# Patient Record
Sex: Male | Born: 1955 | Race: White | Hispanic: No | Marital: Married | Smoking: Never smoker
Health system: Southern US, Community
[De-identification: ages and names within clinical notes are randomized; demographics above are authoritative.]

## PROBLEM LIST (undated history)

## (undated) DIAGNOSIS — K279 Peptic ulcer, site unspecified, unspecified as acute or chronic, without hemorrhage or perforation: Secondary | ICD-10-CM

## (undated) DIAGNOSIS — Z9289 Personal history of other medical treatment: Secondary | ICD-10-CM

## (undated) HISTORY — PX: HERNIA REPAIR: SHX51

## (undated) HISTORY — PX: APPENDECTOMY: SHX54

## (undated) HISTORY — DX: Personal history of other medical treatment: Z92.89

## (undated) HISTORY — DX: Peptic ulcer, site unspecified, unspecified as acute or chronic, without hemorrhage or perforation: K27.9

---

## 2003-07-02 ENCOUNTER — Encounter: Admission: RE | Admit: 2003-07-02 | Discharge: 2003-07-02 | Payer: Self-pay | Admitting: Specialist

## 2004-08-30 ENCOUNTER — Ambulatory Visit: Payer: Self-pay | Admitting: Family Medicine

## 2004-09-14 ENCOUNTER — Ambulatory Visit: Payer: Self-pay | Admitting: Family Medicine

## 2005-07-02 ENCOUNTER — Ambulatory Visit (HOSPITAL_COMMUNITY): Admission: RE | Admit: 2005-07-02 | Discharge: 2005-07-02 | Payer: Self-pay | Admitting: Family Medicine

## 2005-07-02 ENCOUNTER — Encounter (INDEPENDENT_AMBULATORY_CARE_PROVIDER_SITE_OTHER): Payer: Self-pay | Admitting: Specialist

## 2005-07-02 ENCOUNTER — Ambulatory Visit (HOSPITAL_COMMUNITY): Admission: EM | Admit: 2005-07-02 | Discharge: 2005-07-03 | Payer: Self-pay | Admitting: Emergency Medicine

## 2005-07-02 ENCOUNTER — Ambulatory Visit: Payer: Self-pay | Admitting: Family Medicine

## 2005-08-04 ENCOUNTER — Emergency Department (HOSPITAL_COMMUNITY): Admission: EM | Admit: 2005-08-04 | Discharge: 2005-08-05 | Payer: Self-pay | Admitting: Emergency Medicine

## 2006-09-04 ENCOUNTER — Ambulatory Visit: Payer: Self-pay | Admitting: Family Medicine

## 2006-09-04 LAB — CONVERTED CEMR LAB
Bilirubin Urine: NEGATIVE
Blood in Urine, dipstick: NEGATIVE
Nitrite: NEGATIVE
Urobilinogen, UA: 0.2
WBC Urine, dipstick: NEGATIVE
pH: 5

## 2006-09-05 LAB — CONVERTED CEMR LAB
ALT: 29 units/L (ref 0–53)
AST: 21 units/L (ref 0–37)
Albumin: 4.1 g/dL (ref 3.5–5.2)
Basophils Absolute: 0 10*3/uL (ref 0.0–0.1)
Bilirubin, Direct: 0.1 mg/dL (ref 0.0–0.3)
CO2: 29 meq/L (ref 19–32)
Calcium: 9.5 mg/dL (ref 8.4–10.5)
Chloride: 109 meq/L (ref 96–112)
Cholesterol: 163 mg/dL (ref 0–200)
Eosinophils Absolute: 0.3 10*3/uL (ref 0.0–0.6)
Eosinophils Relative: 4.9 % (ref 0.0–5.0)
Glucose, Bld: 93 mg/dL (ref 70–99)
HCT: 45.5 % (ref 39.0–52.0)
HDL: 34.4 mg/dL — ABNORMAL LOW (ref >39.0)
Hemoglobin: 15.5 g/dL (ref 13.0–17.0)
MCHC: 34.1 g/dL (ref 30.0–36.0)
MCV: 89.3 fL (ref 78.0–100.0)
Monocytes Absolute: 0.5 10*3/uL (ref 0.2–0.7)
Monocytes Relative: 8 % (ref 3.0–11.0)
PSA: 1.02 ng/mL (ref 0.10–4.00)
Potassium: 4.3 meq/L (ref 3.5–5.1)
RDW: 12.5 % (ref 11.5–14.6)
Sodium: 144 meq/L (ref 135–145)
Triglycerides: 122 mg/dL (ref 0–149)

## 2006-09-11 ENCOUNTER — Ambulatory Visit: Payer: Self-pay | Admitting: Family Medicine

## 2006-09-24 ENCOUNTER — Ambulatory Visit: Payer: Self-pay | Admitting: Gastroenterology

## 2006-09-25 ENCOUNTER — Telehealth: Payer: Self-pay | Admitting: Family Medicine

## 2007-11-03 ENCOUNTER — Ambulatory Visit: Payer: Self-pay | Admitting: Family Medicine

## 2007-11-03 DIAGNOSIS — K279 Peptic ulcer, site unspecified, unspecified as acute or chronic, without hemorrhage or perforation: Secondary | ICD-10-CM | POA: Insufficient documentation

## 2007-12-09 ENCOUNTER — Ambulatory Visit: Payer: Self-pay | Admitting: Family Medicine

## 2007-12-09 LAB — CONVERTED CEMR LAB
Bilirubin Urine: NEGATIVE
Glucose, Urine, Semiquant: NEGATIVE
Ketones, urine, test strip: NEGATIVE
Specific Gravity, Urine: 1.02

## 2007-12-12 LAB — CONVERTED CEMR LAB
ALT: 22 units/L (ref 0–53)
BUN: 22 mg/dL (ref 6–23)
Calcium: 9.2 mg/dL (ref 8.4–10.5)
Cholesterol: 174 mg/dL (ref 0–200)
Creatinine, Ser: 1 mg/dL (ref 0.4–1.5)
Eosinophils Relative: 2.2 % (ref 0.0–5.0)
GFR calc non Af Amer: 84 mL/min
HCT: 44.6 % (ref 39.0–52.0)
HDL: 39.6 mg/dL (ref >39.0)
Lymphocytes Relative: 30.6 % (ref 12.0–46.0)
MCHC: 34.4 g/dL (ref 30.0–36.0)
MCV: 91.1 fL (ref 78.0–100.0)
Monocytes Absolute: 0.4 10*3/uL (ref 0.1–1.0)
Monocytes Relative: 6.3 % (ref 3.0–12.0)
Neutro Abs: 3.7 10*3/uL (ref 1.4–7.7)
Potassium: 4.2 meq/L (ref 3.5–5.1)
Sodium: 143 meq/L (ref 135–145)
TSH: 0.68 microintl units/mL (ref 0.35–5.50)
Total CHOL/HDL Ratio: 4.4
Total Protein: 7.1 g/dL (ref 6.0–8.3)
Triglycerides: 129 mg/dL (ref 0–149)
VLDL: 26 mg/dL (ref 0–40)
WBC: 6 10*3/uL (ref 4.5–10.5)

## 2007-12-16 ENCOUNTER — Ambulatory Visit: Payer: Self-pay | Admitting: Family Medicine

## 2008-02-25 ENCOUNTER — Ambulatory Visit: Payer: Self-pay | Admitting: Gastroenterology

## 2008-03-08 ENCOUNTER — Encounter: Payer: Self-pay | Admitting: Family Medicine

## 2008-03-08 ENCOUNTER — Ambulatory Visit: Payer: Self-pay

## 2008-04-26 ENCOUNTER — Telehealth: Payer: Self-pay | Admitting: Family Medicine

## 2008-05-04 ENCOUNTER — Encounter: Payer: Self-pay | Admitting: Family Medicine

## 2010-03-14 ENCOUNTER — Ambulatory Visit: Payer: Self-pay | Admitting: Family Medicine

## 2010-04-11 ENCOUNTER — Encounter: Payer: Self-pay | Admitting: Family Medicine

## 2010-04-25 ENCOUNTER — Encounter: Payer: Self-pay | Admitting: Family Medicine

## 2010-04-25 ENCOUNTER — Ambulatory Visit (INDEPENDENT_AMBULATORY_CARE_PROVIDER_SITE_OTHER): Payer: BLUE CROSS/BLUE SHIELD | Admitting: Family Medicine

## 2010-04-25 VITALS — BP 140/90 | HR 77 | Temp 98.5°F | Wt 206.0 lb

## 2010-04-25 DIAGNOSIS — Z0000 Encounter for general adult medical examination without abnormal findings: Secondary | ICD-10-CM

## 2010-04-25 NOTE — Progress Notes (Signed)
  Subjective:    Patient ID: Bryan Hurst, male    DOB: 02-Oct-1955, 55 y.o.   MRN: [id number]  HPI 55 yr old male for a cpx. He feels well  And has no complaints. He still runs every day.    Review of Systems  Constitutional: Negative.   HENT: Negative.   Eyes: Negative.   Respiratory: Negative.   Cardiovascular: Negative.   Gastrointestinal: Negative.   Genitourinary: Negative.   Musculoskeletal: Negative.   Skin: Negative.   Neurological: Negative.   Hematological: Negative.   Psychiatric/Behavioral: Negative.        Objective:   Physical Exam  Constitutional: He is oriented to person, place, and time. He appears well-developed and well-nourished. No distress.  HENT:  Head: Normocephalic and atraumatic.  Right Ear: External ear normal.  Left Ear: External ear normal.  Nose: Nose normal.  Mouth/Throat: Oropharynx is clear and moist. No oropharyngeal exudate.  Eyes: Conjunctivae and EOM are normal. Pupils are equal, round, and reactive to light. Right eye exhibits no discharge. Left eye exhibits no discharge. No scleral icterus.  Neck: Neck supple. No JVD present. No tracheal deviation present. No thyromegaly present.  Cardiovascular: Normal rate, regular rhythm, normal heart sounds and intact distal pulses.  Exam reveals no gallop and no friction rub.   No murmur heard. Pulmonary/Chest: Effort normal and breath sounds normal. No respiratory distress. He has no wheezes. He has no rales. He exhibits no tenderness.  Abdominal: Soft. Bowel sounds are normal. He exhibits no distension and no mass. There is no tenderness. There is no rebound and no guarding.  Genitourinary: Rectum normal, prostate normal and penis normal. Guaiac negative stool. No penile tenderness.  Musculoskeletal: Normal range of motion. He exhibits no edema and no tenderness.  Lymphadenopathy:    He has no cervical adenopathy.  Neurological: He is alert and oriented to person, place, and time. He has normal  reflexes. No cranial nerve deficit. He exhibits normal muscle tone. Coordination normal.  Skin: Skin is warm and dry. No rash noted. He is not diaphoretic. No erythema. No pallor.  Psychiatric: He has a normal mood and affect. His behavior is normal. Judgment and thought content normal.          Assessment & Plan:  Get labs soon. He will keep an eye on the BP.

## 2010-05-02 ENCOUNTER — Telehealth: Payer: Self-pay

## 2010-05-02 ENCOUNTER — Other Ambulatory Visit (INDEPENDENT_AMBULATORY_CARE_PROVIDER_SITE_OTHER): Payer: BLUE CROSS/BLUE SHIELD

## 2010-05-02 ENCOUNTER — Other Ambulatory Visit: Payer: Self-pay | Admitting: Family Medicine

## 2010-05-02 DIAGNOSIS — Z0000 Encounter for general adult medical examination without abnormal findings: Secondary | ICD-10-CM

## 2010-05-02 LAB — HEPATIC FUNCTION PANEL
Albumin: 4 g/dL (ref 3.5–5.2)
Alkaline Phosphatase: 59 U/L (ref 39–117)
Bilirubin, Direct: 0.2 mg/dL (ref 0.0–0.3)
Total Bilirubin: 1.1 mg/dL (ref 0.3–1.2)

## 2010-05-02 LAB — LIPID PANEL
LDL Cholesterol: 110 mg/dL — ABNORMAL HIGH (ref 0–99)
Total CHOL/HDL Ratio: 4
VLDL: 22.6 mg/dL (ref 0.0–40.0)

## 2010-05-02 LAB — URINALYSIS
Nitrite: NEGATIVE
Urobilinogen, UA: 0.2 (ref 0.0–1.0)

## 2010-05-02 LAB — CBC WITH DIFFERENTIAL/PLATELET
Basophils Absolute: 0 10*3/uL (ref 0.0–0.1)
Hemoglobin: 15.8 g/dL (ref 13.0–17.0)
Lymphs Abs: 2.3 10*3/uL (ref 0.7–4.0)
MCHC: 34.7 g/dL (ref 30.0–36.0)
MCV: 90.3 fl (ref 78.0–100.0)
Monocytes Absolute: 0.5 10*3/uL (ref 0.1–1.0)
Neutro Abs: 3 10*3/uL (ref 1.4–7.7)
Neutrophils Relative %: 49.4 % (ref 43.0–77.0)
WBC: 6.1 10*3/uL (ref 4.5–10.5)

## 2010-05-02 LAB — BASIC METABOLIC PANEL
CO2: 31 mEq/L (ref 19–32)
Calcium: 9.2 mg/dL (ref 8.4–10.5)
GFR: 65.77 mL/min (ref >60.00)
Potassium: 4.7 mEq/L (ref 3.5–5.1)
Sodium: 141 mEq/L (ref 135–145)

## 2010-05-02 LAB — TSH: TSH: 0.87 u[IU]/mL (ref 0.35–5.50)

## 2010-05-02 NOTE — Telephone Encounter (Signed)
Pt called mess left labs normal and to return call if questions

## 2010-05-02 NOTE — Telephone Encounter (Signed)
Message copied by Madison Hickman on Tue May 02, 2010  2:42 PM ------      Message from: Dwaine Deter      Created: Tue May 02, 2010 11:20 AM       Normal except slightly high chol. Watch the diet

## 2010-05-15 ENCOUNTER — Telehealth: Payer: Self-pay | Admitting: *Deleted

## 2010-05-15 NOTE — Telephone Encounter (Signed)
Please call lab results to pt.

## 2010-05-16 NOTE — Telephone Encounter (Signed)
Pt informed. Mailed copy of results per Pt request.

## 2010-05-16 NOTE — Telephone Encounter (Signed)
These were from 05-02-10. We called and left a message that they were normal. Please call him back

## 2010-06-02 NOTE — Op Note (Signed)
Bryan, Hurst                ACCOUNT NO.:  [id number]   MEDICAL RECORD NO.:  [id number]          PATIENT TYPE:  INP   LOCATION:  8119                         FACILITY:  [facility]   PHYSICIAN:  Lebron Conners, M.D.   DATE OF BIRTH:  1955/07/15   DATE OF PROCEDURE:  07/02/2005  DATE OF DISCHARGE:  07/03/2005                                 OPERATIVE REPORT   PREOPERATIVE DIAGNOSIS:  Acute appendicitis.   POSTOPERATIVE DIAGNOSIS:  Acute appendicitis.   OPERATION:  Laparoscopic appendectomy.   SURGEON:  Lebron Conners, M.D.   ANESTHESIA:  General.   BLOOD LOSS:  Minimal.   COMPLICATIONS:  None.   SPECIMEN:  Appendix.   CONDITION TO PACU:  Good.   PROCEDURE:  After the patient was monitored and anesthetized and had a  urinary catheter and routine preparation and draping of the abdomen, I  anesthetized the area just below the umbilicus with long-acting local  anesthetic.  I then made a 2 cm vertical incision dissected down to the  fascia and made a 2 cm vertical incision and bluntly entered the peritoneal  cavity.  I placed a 0 Vicryl pursestring suture in the fascia and secured a  Hassan cannula and inflated the abdomen with carbon dioxide.  I examined the  right lower quadrant and saw an inflamed appendix.  I then placed a 5 mm  right upper quadrant port and an 11 mm lower abdominal port just to the left  of the midline under direct view, noting that no viscera were injured on  implantation of the ports through anesthetized sites.  I grasped the  appendix with large ratcheting grasper and elevated it and found that it was  free of adhesions to the pelvic wall.  I could clearly see the base of the  appendix and saw that it was healthy.  After working a little bit to  decrease the volume of the mesentery I stapled across the appendix at its  base utilizing a endoscopic cutting stapler included the mesentery and  stapled.  A second firing was required to complete the staple  line and there  was a secure amputation of the appendix.  I put it in a plastic pouch and  held it in the right gutter.  I irrigated the operative area and removed the  irrigant and got good hemostasis.  After I was satisfied with hemostasis, I  removed the appendix from the umbilical incision and tied that pursestring  suture.  I again examined the operative area and the other areas  of abdomen and saw good hemostasis and saw no further abnormalities.  I  allowed the carbon dioxide to escape after removing the right upper quadrant  port and then removed the lower midline port and closed all incisions with  intracuticular 4-0 Vicryl and Steri-Strips.  The patient was stable  throughout and went to PACU in good condition.      Lebron Conners, M.D.  Electronically Signed     WB/MEDQ  D:  07/02/2005  T:  07/03/2005  Job:  [id number]   cc:   Jeannett Senior A.  Clent Ridges, M.D. [facility]  [address] [place]  [place] [address]

## 2012-07-21 ENCOUNTER — Ambulatory Visit (INDEPENDENT_AMBULATORY_CARE_PROVIDER_SITE_OTHER): Payer: 59 | Admitting: Family Medicine

## 2012-07-21 ENCOUNTER — Encounter: Payer: Self-pay | Admitting: Family Medicine

## 2012-07-21 VITALS — BP 120/80 | HR 77 | Temp 98.3°F | Wt 206.0 lb

## 2012-07-21 DIAGNOSIS — Z5189 Encounter for other specified aftercare: Secondary | ICD-10-CM

## 2012-07-21 DIAGNOSIS — S0101XD Laceration without foreign body of scalp, subsequent encounter: Secondary | ICD-10-CM

## 2012-07-21 NOTE — Progress Notes (Signed)
  Subjective:    Patient ID: Bryan Hurst, male    DOB: 1955-10-12, 57 y.o.   MRN: [id number]  HPI Here to remove staples form the vertex of the scalp. On 07-11-12 he was playing doubles tennis when his partner and he ran into each other. This made him lose his balance and fall, striking his head on the concrete. He went to Urgent Care and had 3 staples placed. There was no LOC and no concussion sx. The area feels fine now.    Review of Systems  Constitutional: Negative.   Neurological: Negative.        Objective:   Physical Exam  Constitutional: He is oriented to person, place, and time. He appears well-developed and well-nourished.  Neurological: He is alert and oriented to person, place, and time.  Skin:  The scalp wound is clean and closed          Assessment & Plan:  All staples were removed. Recheck prn

## 2013-12-16 ENCOUNTER — Other Ambulatory Visit (INDEPENDENT_AMBULATORY_CARE_PROVIDER_SITE_OTHER): Payer: 59

## 2013-12-16 DIAGNOSIS — Z0000 Encounter for general adult medical examination without abnormal findings: Secondary | ICD-10-CM

## 2013-12-16 LAB — POCT URINALYSIS DIPSTICK
Bilirubin, UA: NEGATIVE
Blood, UA: NEGATIVE
GLUCOSE UA: NEGATIVE
Ketones, UA: NEGATIVE
Leukocytes, UA: NEGATIVE
Nitrite, UA: NEGATIVE
PH UA: 6
Protein, UA: NEGATIVE
Spec Grav, UA: 1.015
UROBILINOGEN UA: 0.2

## 2013-12-16 LAB — CBC WITH DIFFERENTIAL/PLATELET
BASOS PCT: 0.4 % (ref 0.0–3.0)
Basophils Absolute: 0 10*3/uL (ref 0.0–0.1)
EOS ABS: 0.2 10*3/uL (ref 0.0–0.7)
EOS PCT: 2.8 % (ref 0.0–5.0)
HCT: 48.1 % (ref 39.0–52.0)
Hemoglobin: 16.2 g/dL (ref 13.0–17.0)
LYMPHS PCT: 34.8 % (ref 12.0–46.0)
Lymphs Abs: 2.4 10*3/uL (ref 0.7–4.0)
MCHC: 33.6 g/dL (ref 30.0–36.0)
MCV: 89.4 fl (ref 78.0–100.0)
MONO ABS: 0.5 10*3/uL (ref 0.1–1.0)
Monocytes Relative: 7 % (ref 3.0–12.0)
Neutro Abs: 3.7 10*3/uL (ref 1.4–7.7)
Neutrophils Relative %: 55 % (ref 43.0–77.0)
Platelets: 184 10*3/uL (ref 150.0–400.0)
RBC: 5.38 Mil/uL (ref 4.22–5.81)
RDW: 13.6 % (ref 11.5–15.5)
WBC: 6.8 10*3/uL (ref 4.0–10.5)

## 2013-12-16 LAB — HEPATIC FUNCTION PANEL
ALBUMIN: 4.5 g/dL (ref 3.5–5.2)
ALT: 24 U/L (ref 0–53)
AST: 22 U/L (ref 0–37)
Alkaline Phosphatase: 65 U/L (ref 39–117)
BILIRUBIN TOTAL: 1.2 mg/dL (ref 0.2–1.2)
Bilirubin, Direct: 0.2 mg/dL (ref 0.0–0.3)
TOTAL PROTEIN: 7 g/dL (ref 6.0–8.3)

## 2013-12-16 LAB — BASIC METABOLIC PANEL
BUN: 24 mg/dL — AB (ref 6–23)
CHLORIDE: 104 meq/L (ref 96–112)
CO2: 28 mEq/L (ref 19–32)
CREATININE: 1.3 mg/dL (ref 0.4–1.5)
Calcium: 9.2 mg/dL (ref 8.4–10.5)
GFR: 62.54 mL/min (ref >60.00)
GLUCOSE: 87 mg/dL (ref 70–99)
POTASSIUM: 4.9 meq/L (ref 3.5–5.1)
Sodium: 138 mEq/L (ref 135–145)

## 2013-12-16 LAB — LIPID PANEL
CHOL/HDL RATIO: 5
CHOLESTEROL: 207 mg/dL — AB (ref 0–200)
HDL: 42.2 mg/dL (ref >39.00)
LDL Cholesterol: 144 mg/dL — ABNORMAL HIGH (ref 0–99)
NONHDL: 164.8
TRIGLYCERIDES: 105 mg/dL (ref 0.0–149.0)
VLDL: 21 mg/dL (ref 0.0–40.0)

## 2013-12-16 LAB — TSH: TSH: 1.06 u[IU]/mL (ref 0.35–4.50)

## 2013-12-16 LAB — PSA: PSA: 1.68 ng/mL (ref 0.10–4.00)

## 2013-12-23 ENCOUNTER — Encounter: Payer: Self-pay | Admitting: Family Medicine

## 2013-12-23 ENCOUNTER — Ambulatory Visit (INDEPENDENT_AMBULATORY_CARE_PROVIDER_SITE_OTHER): Payer: 59 | Admitting: Family Medicine

## 2013-12-23 VITALS — BP 112/67 | HR 84 | Temp 98.6°F | Ht 73.5 in | Wt 206.0 lb

## 2013-12-23 DIAGNOSIS — Z0000 Encounter for general adult medical examination without abnormal findings: Secondary | ICD-10-CM

## 2013-12-23 NOTE — Progress Notes (Signed)
Pre visit review using our clinic review tool, if applicable. No additional management support is needed unless otherwise documented below in the visit note. 

## 2013-12-23 NOTE — Progress Notes (Signed)
   Subjective:    Patient ID: Bryan Hurst, male    DOB: [birth year], 58 y.o.   MRN: [id number]  HPI 58 yr old male for a cpx. He feels well.    Review of Systems  Constitutional: Negative.   HENT: Negative.   Eyes: Negative.   Respiratory: Negative.   Cardiovascular: Negative.   Gastrointestinal: Negative.   Genitourinary: Negative.   Musculoskeletal: Negative.   Skin: Negative.   Neurological: Negative.   Psychiatric/Behavioral: Negative.        Objective:   Physical Exam  Constitutional: He is oriented to person, place, and time. He appears well-developed and well-nourished. No distress.  HENT:  Head: Normocephalic and atraumatic.  Right Ear: External ear normal.  Left Ear: External ear normal.  Nose: Nose normal.  Mouth/Throat: Oropharynx is clear and moist. No oropharyngeal exudate.  Eyes: Conjunctivae and EOM are normal. Pupils are equal, round, and reactive to light. Right eye exhibits no discharge. Left eye exhibits no discharge. No scleral icterus.  Neck: Neck supple. No JVD present. No tracheal deviation present. No thyromegaly present.  Cardiovascular: Normal rate, regular rhythm, normal heart sounds and intact distal pulses.  Exam reveals no gallop and no friction rub.   No murmur heard. Pulmonary/Chest: Effort normal and breath sounds normal. No respiratory distress. He has no wheezes. He has no rales. He exhibits no tenderness.  Abdominal: Soft. Bowel sounds are normal. He exhibits no distension and no mass. There is no tenderness. There is no rebound and no guarding.  Genitourinary: Rectum normal, prostate normal and penis normal. Guaiac negative stool. No penile tenderness.  Musculoskeletal: Normal range of motion. He exhibits no edema or tenderness.  Lymphadenopathy:    He has no cervical adenopathy.  Neurological: He is alert and oriented to person, place, and time. He has normal reflexes. No cranial nerve deficit. He exhibits normal muscle tone. Coordination  normal.  Skin: Skin is warm and dry. No rash noted. He is not diaphoretic. No erythema. No pallor.  Psychiatric: He has a normal mood and affect. His behavior is normal. Judgment and thought content normal.          Assessment & Plan:  Well exam. Set up a colonoscopy

## 2013-12-30 ENCOUNTER — Encounter: Payer: Self-pay | Admitting: Gastroenterology

## 2014-02-17 ENCOUNTER — Ambulatory Visit (AMBULATORY_SURGERY_CENTER): Payer: Self-pay | Admitting: *Deleted

## 2014-02-17 VITALS — Ht 74.0 in | Wt 206.8 lb

## 2014-02-17 DIAGNOSIS — Z1211 Encounter for screening for malignant neoplasm of colon: Secondary | ICD-10-CM

## 2014-02-17 MED ORDER — NA SULFATE-K SULFATE-MG SULF 17.5-3.13-1.6 GM/177ML PO SOLN: 1.0000 | Freq: Once | ORAL | Status: DC

## 2014-02-17 NOTE — Progress Notes (Signed)
No egg or soy allergy No issues with past sedation No home 02 use No diet pills Pt declined emmi video

## 2014-03-03 ENCOUNTER — Encounter: Payer: Self-pay | Admitting: Gastroenterology

## 2014-03-03 ENCOUNTER — Ambulatory Visit (AMBULATORY_SURGERY_CENTER): Payer: 59 | Admitting: Gastroenterology

## 2014-03-03 VITALS — BP 123/65 | HR 61 | Temp 97.5°F | Resp 17 | Ht 74.0 in | Wt 206.0 lb

## 2014-03-03 DIAGNOSIS — K5730 Diverticulosis of large intestine without perforation or abscess without bleeding: Secondary | ICD-10-CM

## 2014-03-03 DIAGNOSIS — Z1211 Encounter for screening for malignant neoplasm of colon: Secondary | ICD-10-CM

## 2014-03-03 HISTORY — PX: COLONOSCOPY: SHX174

## 2014-03-03 MED ORDER — SODIUM CHLORIDE 0.9 % IV SOLN: 500.0000 mL | INTRAVENOUS | Status: DC

## 2014-03-03 NOTE — Op Note (Signed)
[facility] [address] [place], [id number]   COLONOSCOPY PROCEDURE REPORT  PATIENT: Bryan RougeBursche, Stephone  MR#: [id number] BIRTHDATE: 12-Nov-1955 , 58  yrs. old GENDER: male ENDOSCOPIST: Louis Meckelobert D Belvie Iribe, MD REFERRED NU:UVOZDGUBY:Stephen Clent RidgesFry, M.D. PROCEDURE DATE:  03/03/2014 PROCEDURE:   Colonoscopy, diagnostic First Screening Colonoscopy - Avg.  risk and is 50 yrs.  old or older Yes.  Prior Negative Screening - Now for repeat screening. N/A  History of Adenoma - Now for follow-up colonoscopy & has been > or = to 3 yrs.  N/A  Polyps Removed Today? No.  Recommend repeat exam, <10 yrs? No. ASA CLASS:   Class I INDICATIONS:average risk patient for colon cancer. MEDICATIONS: Monitored anesthesia care and Propofol 200 mg IV  DESCRIPTION OF PROCEDURE:   After the risks benefits and alternatives of the procedure were thoroughly explained, informed consent was obtained.  The digital rectal exam revealed no abnormalities of the rectum.   The LB [id number]  endoscope was introduced through the anus and advanced to the cecum, which was identified by both the appendix and ileocecal valve. No adverse events experienced.   The quality of the prep was excellent using Suprep  The instrument was then slowly withdrawn as the colon was fully examined.      COLON FINDINGS: There was mild diverticulosis noted in the sigmoid colon.   The examination was otherwise normal.  Retroflexed views revealed no abnormalities. The time to cecum=2 minutes 45 seconds. Withdrawal time=8 minutes 44 seconds.  The scope was withdrawn and the procedure completed. COMPLICATIONS: There were no immediate complications.  ENDOSCOPIC IMPRESSION: 1.   Mild diverticulosis was noted in the sigmoid colon 2.   The examination was otherwise normal  RECOMMENDATIONS: Continue current colorectal screening recommendations for "routine risk" patients with a repeat colonoscopy in 10 years.  eSigned:  Louis Meckelobert D Hughie Melroy,  MD 03/03/2014 10:18 AM   cc:

## 2014-03-03 NOTE — Patient Instructions (Signed)
YOU HAD AN ENDOSCOPIC PROCEDURE TODAY AT THE [facility]: Refer to the procedure report that was given to you for any specific questions about what was found during the examination.  If the procedure report does not answer your questions, please call your gastroenterologist to clarify.  If you requested that your care partner not be given the details of your procedure findings, then the procedure report has been included in a sealed envelope for you to review at your convenience later.  YOU SHOULD EXPECT: Some feelings of bloating in the abdomen. Passage of more gas than usual.  Walking can help get rid of the air that was put into your GI tract during the procedure and reduce the bloating. If you had a lower endoscopy (such as a colonoscopy or flexible sigmoidoscopy) you may notice spotting of blood in your stool or on the toilet paper. If you underwent a bowel prep for your procedure, then you may not have a normal bowel movement for a few days.  DIET: Your first meal following the procedure should be a light meal and then it is ok to progress to your normal diet.  A half-sandwich or bowl of soup is an example of a good first meal.  Heavy or fried foods are harder to digest and may make you feel nauseous or bloated.  Likewise meals heavy in dairy and vegetables can cause extra gas to form and this can also increase the bloating.  Drink plenty of fluids but you should avoid alcoholic beverages for 24 hours.  ACTIVITY: Your care partner should take you home directly after the procedure.  You should plan to take it easy, moving slowly for the rest of the day.  You can resume normal activity the day after the procedure however you should NOT DRIVE or use heavy machinery for 24 hours (because of the sedation medicines used during the test).    SYMPTOMS TO REPORT IMMEDIATELY: A gastroenterologist can be reached at any hour.  During normal business hours, 8:30 AM to 5:00 PM Monday through Friday,  call [phone].  After hours and on weekends, please call the GI answering service at [phone] who will take a message and have the physician on call contact you.   Following lower endoscopy (colonoscopy or flexible sigmoidoscopy):  Excessive amounts of blood in the stool  Significant tenderness or worsening of abdominal pains  Swelling of the abdomen that is new, acute  Fever of 100F or higher  FOLLOW UP: If any biopsies were taken you will be contacted by phone or by letter within the next 1-3 weeks.  Call your gastroenterologist if you have not heard about the biopsies in 3 weeks.  Our staff will call the home number listed on your records the next business day following your procedure to check on you and address any questions or concerns that you may have at that time regarding the information given to you following your procedure. This is a courtesy call and so if there is no answer at the home number and we have not heard from you through the emergency physician on call, we will assume that you have returned to your regular daily activities without incident.  SIGNATURES/CONFIDENTIALITY: You and/or your care partner have signed paperwork which will be entered into your electronic medical record.  These signatures attest to the fact that that the information above on your After Visit Summary has been reviewed and is understood.  Full responsibility of the confidentiality of this   discharge information lies with you and/or your care-partner.  Resume medications. Information given on diverticulosis and high fiber diet with discharge instructions. 

## 2014-03-03 NOTE — Progress Notes (Signed)
Report to PACU, RN, vss, BBS= Clear.  

## 2014-03-04 ENCOUNTER — Telehealth: Payer: Self-pay | Admitting: *Deleted

## 2014-03-04 NOTE — Telephone Encounter (Signed)
  Follow up Call-  Call back number 03/03/2014  Post procedure Call Back phone  # [phone]  Permission to leave phone message Yes     Patient questions:  Do you have a fever, pain , or abdominal swelling? No. Pain Score  0 *  Have you tolerated food without any problems? Yes.    Have you been able to return to your normal activities? Yes.    Do you have any questions about your discharge instructions: Diet   No. Medications  No. Follow up visit  No.  Do you have questions or concerns about your Care? No.  Actions: * If pain score is 4 or above: No action needed, pain <4.

## 2015-09-08 ENCOUNTER — Other Ambulatory Visit: Payer: 59

## 2015-09-09 ENCOUNTER — Other Ambulatory Visit: Payer: 59

## 2015-09-14 ENCOUNTER — Encounter: Payer: 59 | Admitting: Family Medicine

## 2016-02-06 DIAGNOSIS — M791 Myalgia: Secondary | ICD-10-CM | POA: Diagnosis not present

## 2016-02-06 DIAGNOSIS — M9902 Segmental and somatic dysfunction of thoracic region: Secondary | ICD-10-CM | POA: Diagnosis not present

## 2016-02-06 DIAGNOSIS — M9901 Segmental and somatic dysfunction of cervical region: Secondary | ICD-10-CM | POA: Diagnosis not present

## 2016-03-09 DIAGNOSIS — M791 Myalgia: Secondary | ICD-10-CM | POA: Diagnosis not present

## 2016-03-09 DIAGNOSIS — M9902 Segmental and somatic dysfunction of thoracic region: Secondary | ICD-10-CM | POA: Diagnosis not present

## 2016-03-09 DIAGNOSIS — M9901 Segmental and somatic dysfunction of cervical region: Secondary | ICD-10-CM | POA: Diagnosis not present

## 2016-04-13 DIAGNOSIS — M9902 Segmental and somatic dysfunction of thoracic region: Secondary | ICD-10-CM | POA: Diagnosis not present

## 2016-04-13 DIAGNOSIS — M791 Myalgia: Secondary | ICD-10-CM | POA: Diagnosis not present

## 2016-04-13 DIAGNOSIS — M9901 Segmental and somatic dysfunction of cervical region: Secondary | ICD-10-CM | POA: Diagnosis not present

## 2016-05-11 DIAGNOSIS — M791 Myalgia: Secondary | ICD-10-CM | POA: Diagnosis not present

## 2016-05-11 DIAGNOSIS — M9902 Segmental and somatic dysfunction of thoracic region: Secondary | ICD-10-CM | POA: Diagnosis not present

## 2016-05-11 DIAGNOSIS — M9901 Segmental and somatic dysfunction of cervical region: Secondary | ICD-10-CM | POA: Diagnosis not present

## 2016-06-08 DIAGNOSIS — M9902 Segmental and somatic dysfunction of thoracic region: Secondary | ICD-10-CM | POA: Diagnosis not present

## 2016-06-08 DIAGNOSIS — M9901 Segmental and somatic dysfunction of cervical region: Secondary | ICD-10-CM | POA: Diagnosis not present

## 2016-06-08 DIAGNOSIS — M791 Myalgia: Secondary | ICD-10-CM | POA: Diagnosis not present

## 2016-07-13 DIAGNOSIS — M9902 Segmental and somatic dysfunction of thoracic region: Secondary | ICD-10-CM | POA: Diagnosis not present

## 2016-07-13 DIAGNOSIS — M9901 Segmental and somatic dysfunction of cervical region: Secondary | ICD-10-CM | POA: Diagnosis not present

## 2016-07-13 DIAGNOSIS — M791 Myalgia: Secondary | ICD-10-CM | POA: Diagnosis not present

## 2016-08-13 DIAGNOSIS — M9902 Segmental and somatic dysfunction of thoracic region: Secondary | ICD-10-CM | POA: Diagnosis not present

## 2016-08-13 DIAGNOSIS — M791 Myalgia: Secondary | ICD-10-CM | POA: Diagnosis not present

## 2016-08-13 DIAGNOSIS — M9901 Segmental and somatic dysfunction of cervical region: Secondary | ICD-10-CM | POA: Diagnosis not present

## 2016-08-24 ENCOUNTER — Ambulatory Visit: Payer: 59 | Admitting: Sports Medicine

## 2016-08-24 ENCOUNTER — Ambulatory Visit (INDEPENDENT_AMBULATORY_CARE_PROVIDER_SITE_OTHER): Payer: 59

## 2016-08-24 ENCOUNTER — Ambulatory Visit (INDEPENDENT_AMBULATORY_CARE_PROVIDER_SITE_OTHER): Payer: 59 | Admitting: Sports Medicine

## 2016-08-24 ENCOUNTER — Encounter: Payer: Self-pay | Admitting: Sports Medicine

## 2016-08-24 VITALS — BP 134/80 | HR 70 | Ht 74.0 in | Wt 210.4 lb

## 2016-08-24 DIAGNOSIS — S62637A Displaced fracture of distal phalanx of left little finger, initial encounter for closed fracture: Secondary | ICD-10-CM | POA: Diagnosis not present

## 2016-08-24 DIAGNOSIS — M79645 Pain in left finger(s): Secondary | ICD-10-CM | POA: Diagnosis not present

## 2016-08-24 DIAGNOSIS — M20012 Mallet finger of left finger(s): Secondary | ICD-10-CM | POA: Diagnosis not present

## 2016-08-24 NOTE — Assessment & Plan Note (Signed)
Dorsally angulated fracture with solid callus formation at this time with concomitant extensor tendon avulsion fracture.  No significant pain at this time.  Surgical intervention indicated.  We will plan to [facility] as the patient is a family friend.

## 2016-08-24 NOTE — Progress Notes (Signed)
OFFICE VISIT NOTE [facility]. [facility] [phone]  Massie Mees - 61 y.o. male MRN [id number]  Date of birth: 04-05-1955  Visit Date: 08/24/2016  PCP: Nelwyn Salisbury, MD   Referred by: Nelwyn Salisbury, MD  Zollie Pee, CMA  acting as scribe for Dr. Berline Chough.  SUBJECTIVE:   Chief Complaint  Patient presents with  . New Patient (Initial Visit)   HPI: As below and per problem based documentation when appropriate.  Patient presents today with complaints of an injury to the left pinky finger. Happened 4 to 5 weeks ago. Patient states that his finger got stuck in his dog's collar.  Patient is not currently experiencing any pain. Swollen. No meds for this.  No numbness/tingling. Difficulty bending/flexing.    Review of Systems  Constitutional: Negative for chills, fever and malaise/fatigue.  Cardiovascular: Negative for chest pain and palpitations.  Musculoskeletal: Negative for back pain, falls, joint pain and neck pain.  Neurological: Negative for dizziness, weakness and headaches.  Endo/Heme/Allergies: Does not bruise/bleed easily.    Otherwise per HPI.  HISTORY & PERTINENT PRIOR DATA:  No specialty comments available. He reports that he has never smoked. He has never used smokeless tobacco. No results for input(s): HGBA1C, LABURIC in the last 8760 hours. Medications & Allergies reviewed per EMR Patient Active Problem List   Diagnosis Date Noted  . 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 Closed displaced fracture of distal phalanx of left little finger 08/24/2016  . PEPTIC ULCER DISEASE 11/03/2007   Past Medical History:  Diagnosis Date  . 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 Peptic ulcer disease    Family History  Problem Relation Age of Onset  . Prostate cancer Unknown   . Colon cancer Neg Hx   . Rectal cancer Neg Hx   . Stomach cancer Neg Hx    Past Surgical History:    Procedure Laterality Date  . APPENDECTOMY    . HERNIA REPAIR     Social History   Occupational History  . Not on file.   Social History Main Topics  . Smoking status: Never Smoker  . Smokeless tobacco: Never Used  . Alcohol use 0.0 oz/week     Comment: occ  . Drug use: No  . Sexual activity: Not on file    OBJECTIVE:  VS:  HT:6\' 2"  (188 cm)   WT:210 lb 6.4 oz (95.4 kg)  BMI:27.1    BP:134/80  HR:70bpm  TEMP: ( )  RESP:98 % EXAM: Findings:  WDWN, NAD, Non-toxic appearing Alert & appropriately interactive Not depressed or anxious appearing No increased work of breathing. Pupils are equal. EOM intact without nystagmus No clubbing or cyanosis of the extremities appreciated No significant rashes/lesions/ulcerations overlying the examined area. Radial pulses 2+/4.  No significant generalized UE edema. Sensation intact to light touch in upper extremities.  Left hand: Left  little finger with flexor contracture as well as swelling and slight erythema without significant malalignment.  He is unable to extend the finger at all but is able to flex at the MCP, PIP and DIP.  PIP extension is intact.  He has no focal pain with palpation over the area.  Good capillary refill.  Sensation is intact light touch.  X-rays reviewed do show a mallet finger with concomitant proximal horizontal fracture of the distal phalanx with dorsal displacement of approximately 35-45 degrees.  No results found. ASSESSMENT & PLAN:     ICD-10-CM   1. Pain in finger of left hand M79.645 DG Finger Little Left    Ambulatory referral to Orthopedics  2. 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 Closed displaced fracture of distal phalanx of left little finger, initial encounter S62.637A Ambulatory referral to Orthopedics  ================================================================= Closed displaced fracture of distal phalanx of left little finger Dorsally angulated fracture  with solid callus formation at this time with concomitant extensor tendon avulsion fracture.  No significant pain at this time.  Surgical intervention indicated.  We will plan to defer [facility] as the patient is a family friend.   =================================================================  Follow-up: Return if symptoms worsen or fail to improve.   CMA/ATC served as Neurosurgeonscribe during this visit. History, Physical, and Plan performed by medical provider. Documentation and orders reviewed and attested to.      Gaspar BiddingMichael Rigby, DO    [facility] Phys[facility]

## 2016-09-04 DIAGNOSIS — S62667A Nondisplaced fracture of distal phalanx of left little finger, initial encounter for closed fracture: Secondary | ICD-10-CM | POA: Diagnosis not present

## 2016-09-11 DIAGNOSIS — S62667D Nondisplaced fracture of distal phalanx of left little finger, subsequent encounter for fracture with routine healing: Secondary | ICD-10-CM | POA: Diagnosis not present

## 2016-09-19 DIAGNOSIS — S62667D Nondisplaced fracture of distal phalanx of left little finger, subsequent encounter for fracture with routine healing: Secondary | ICD-10-CM | POA: Diagnosis not present

## 2016-09-21 DIAGNOSIS — M9902 Segmental and somatic dysfunction of thoracic region: Secondary | ICD-10-CM | POA: Diagnosis not present

## 2016-09-21 DIAGNOSIS — M791 Myalgia: Secondary | ICD-10-CM | POA: Diagnosis not present

## 2016-09-21 DIAGNOSIS — M9901 Segmental and somatic dysfunction of cervical region: Secondary | ICD-10-CM | POA: Diagnosis not present

## 2016-09-25 DIAGNOSIS — S62667D Nondisplaced fracture of distal phalanx of left little finger, subsequent encounter for fracture with routine healing: Secondary | ICD-10-CM | POA: Diagnosis not present

## 2016-10-09 DIAGNOSIS — S62667D Nondisplaced fracture of distal phalanx of left little finger, subsequent encounter for fracture with routine healing: Secondary | ICD-10-CM | POA: Diagnosis not present

## 2016-10-23 DIAGNOSIS — S62667D Nondisplaced fracture of distal phalanx of left little finger, subsequent encounter for fracture with routine healing: Secondary | ICD-10-CM | POA: Diagnosis not present

## 2016-10-31 DIAGNOSIS — M9902 Segmental and somatic dysfunction of thoracic region: Secondary | ICD-10-CM | POA: Diagnosis not present

## 2016-10-31 DIAGNOSIS — M7912 Myalgia of auxiliary muscles, head and neck: Secondary | ICD-10-CM | POA: Diagnosis not present

## 2016-10-31 DIAGNOSIS — M9901 Segmental and somatic dysfunction of cervical region: Secondary | ICD-10-CM | POA: Diagnosis not present

## 2016-11-20 DIAGNOSIS — S62667D Nondisplaced fracture of distal phalanx of left little finger, subsequent encounter for fracture with routine healing: Secondary | ICD-10-CM | POA: Diagnosis not present

## 2016-11-28 DIAGNOSIS — M9902 Segmental and somatic dysfunction of thoracic region: Secondary | ICD-10-CM | POA: Diagnosis not present

## 2016-11-28 DIAGNOSIS — M9901 Segmental and somatic dysfunction of cervical region: Secondary | ICD-10-CM | POA: Diagnosis not present

## 2016-11-28 DIAGNOSIS — M7912 Myalgia of auxiliary muscles, head and neck: Secondary | ICD-10-CM | POA: Diagnosis not present

## 2017-01-11 DIAGNOSIS — M9901 Segmental and somatic dysfunction of cervical region: Secondary | ICD-10-CM | POA: Diagnosis not present

## 2017-01-11 DIAGNOSIS — M9902 Segmental and somatic dysfunction of thoracic region: Secondary | ICD-10-CM | POA: Diagnosis not present

## 2017-01-11 DIAGNOSIS — M7912 Myalgia of auxiliary muscles, head and neck: Secondary | ICD-10-CM | POA: Diagnosis not present

## 2017-03-06 DIAGNOSIS — M7912 Myalgia of auxiliary muscles, head and neck: Secondary | ICD-10-CM | POA: Diagnosis not present

## 2017-03-06 DIAGNOSIS — M9902 Segmental and somatic dysfunction of thoracic region: Secondary | ICD-10-CM | POA: Diagnosis not present

## 2017-03-06 DIAGNOSIS — M9901 Segmental and somatic dysfunction of cervical region: Secondary | ICD-10-CM | POA: Diagnosis not present

## 2017-04-01 DIAGNOSIS — M7912 Myalgia of auxiliary muscles, head and neck: Secondary | ICD-10-CM | POA: Diagnosis not present

## 2017-04-01 DIAGNOSIS — M9902 Segmental and somatic dysfunction of thoracic region: Secondary | ICD-10-CM | POA: Diagnosis not present

## 2017-04-01 DIAGNOSIS — M9901 Segmental and somatic dysfunction of cervical region: Secondary | ICD-10-CM | POA: Diagnosis not present

## 2017-05-10 DIAGNOSIS — M7912 Myalgia of auxiliary muscles, head and neck: Secondary | ICD-10-CM | POA: Diagnosis not present

## 2017-05-10 DIAGNOSIS — M9902 Segmental and somatic dysfunction of thoracic region: Secondary | ICD-10-CM | POA: Diagnosis not present

## 2017-05-10 DIAGNOSIS — M9901 Segmental and somatic dysfunction of cervical region: Secondary | ICD-10-CM | POA: Diagnosis not present

## 2017-06-12 DIAGNOSIS — M9901 Segmental and somatic dysfunction of cervical region: Secondary | ICD-10-CM | POA: Diagnosis not present

## 2017-06-12 DIAGNOSIS — M7712 Lateral epicondylitis, left elbow: Secondary | ICD-10-CM | POA: Diagnosis not present

## 2017-06-12 DIAGNOSIS — M9902 Segmental and somatic dysfunction of thoracic region: Secondary | ICD-10-CM | POA: Diagnosis not present

## 2017-07-08 DIAGNOSIS — M7712 Lateral epicondylitis, left elbow: Secondary | ICD-10-CM | POA: Diagnosis not present

## 2017-07-08 DIAGNOSIS — M9902 Segmental and somatic dysfunction of thoracic region: Secondary | ICD-10-CM | POA: Diagnosis not present

## 2017-07-08 DIAGNOSIS — M9901 Segmental and somatic dysfunction of cervical region: Secondary | ICD-10-CM | POA: Diagnosis not present

## 2017-08-21 DIAGNOSIS — M7712 Lateral epicondylitis, left elbow: Secondary | ICD-10-CM | POA: Diagnosis not present

## 2017-08-21 DIAGNOSIS — M9901 Segmental and somatic dysfunction of cervical region: Secondary | ICD-10-CM | POA: Diagnosis not present

## 2017-08-21 DIAGNOSIS — M9902 Segmental and somatic dysfunction of thoracic region: Secondary | ICD-10-CM | POA: Diagnosis not present

## 2017-09-25 DIAGNOSIS — M9902 Segmental and somatic dysfunction of thoracic region: Secondary | ICD-10-CM | POA: Diagnosis not present

## 2017-09-25 DIAGNOSIS — M9901 Segmental and somatic dysfunction of cervical region: Secondary | ICD-10-CM | POA: Diagnosis not present

## 2017-09-25 DIAGNOSIS — M7712 Lateral epicondylitis, left elbow: Secondary | ICD-10-CM | POA: Diagnosis not present

## 2017-10-09 ENCOUNTER — Encounter: Payer: Self-pay | Admitting: Family Medicine

## 2017-10-09 ENCOUNTER — Ambulatory Visit (INDEPENDENT_AMBULATORY_CARE_PROVIDER_SITE_OTHER): Payer: 59 | Admitting: Family Medicine

## 2017-10-09 VITALS — BP 132/86 | HR 60 | Temp 97.8°F | Ht 74.0 in | Wt 209.5 lb

## 2017-10-09 DIAGNOSIS — Z0000 Encounter for general adult medical examination without abnormal findings: Secondary | ICD-10-CM | POA: Diagnosis not present

## 2017-10-09 LAB — HEPATIC FUNCTION PANEL
ALK PHOS: 74 U/L (ref 39–117)
ALT: 21 U/L (ref 0–53)
AST: 18 U/L (ref 0–37)
Albumin: 4.7 g/dL (ref 3.5–5.2)
BILIRUBIN DIRECT: 0.1 mg/dL (ref 0.0–0.3)
Total Bilirubin: 0.8 mg/dL (ref 0.2–1.2)
Total Protein: 7.2 g/dL (ref 6.0–8.3)

## 2017-10-09 LAB — BASIC METABOLIC PANEL
BUN: 21 mg/dL (ref 6–23)
CALCIUM: 9.5 mg/dL (ref 8.4–10.5)
CO2: 30 mEq/L (ref 19–32)
Chloride: 104 mEq/L (ref 96–112)
Creatinine, Ser: 1.06 mg/dL (ref 0.40–1.50)
GFR: 75.36 mL/min (ref >60.00)
Glucose, Bld: 94 mg/dL (ref 70–99)
POTASSIUM: 4.7 meq/L (ref 3.5–5.1)
SODIUM: 140 meq/L (ref 135–145)

## 2017-10-09 LAB — LIPID PANEL
CHOL/HDL RATIO: 4
Cholesterol: 178 mg/dL (ref 0–200)
HDL: 40.9 mg/dL (ref >39.00)
LDL CALC: 112 mg/dL — AB (ref 0–99)
NONHDL: 137.51
Triglycerides: 126 mg/dL (ref 0.0–149.0)
VLDL: 25.2 mg/dL (ref 0.0–40.0)

## 2017-10-09 LAB — POC URINALSYSI DIPSTICK (AUTOMATED)
Bilirubin, UA: NEGATIVE
Blood, UA: NEGATIVE
GLUCOSE UA: NEGATIVE
Ketones, UA: NEGATIVE
Leukocytes, UA: NEGATIVE
NITRITE UA: NEGATIVE
Protein, UA: NEGATIVE
SPEC GRAV UA: 1.015 (ref 1.010–1.025)
Urobilinogen, UA: 0.2 E.U./dL
pH, UA: 6 (ref 5.0–8.0)

## 2017-10-09 LAB — CBC WITH DIFFERENTIAL/PLATELET
Basophils Absolute: 0 10*3/uL (ref 0.0–0.1)
Basophils Relative: 0.9 % (ref 0.0–3.0)
EOS PCT: 2.7 % (ref 0.0–5.0)
Eosinophils Absolute: 0.2 10*3/uL (ref 0.0–0.7)
HEMATOCRIT: 46.9 % (ref 39.0–52.0)
Hemoglobin: 16.2 g/dL (ref 13.0–17.0)
LYMPHS PCT: 28.5 % (ref 12.0–46.0)
Lymphs Abs: 1.6 10*3/uL (ref 0.7–4.0)
MCHC: 34.5 g/dL (ref 30.0–36.0)
MCV: 88.2 fl (ref 78.0–100.0)
MONOS PCT: 6.9 % (ref 3.0–12.0)
Monocytes Absolute: 0.4 10*3/uL (ref 0.1–1.0)
Neutro Abs: 3.5 10*3/uL (ref 1.4–7.7)
Neutrophils Relative %: 61 % (ref 43.0–77.0)
Platelets: 207 10*3/uL (ref 150.0–400.0)
RBC: 5.31 Mil/uL (ref 4.22–5.81)
RDW: 13.4 % (ref 11.5–15.5)
WBC: 5.8 10*3/uL (ref 4.0–10.5)

## 2017-10-09 LAB — TSH: TSH: 0.71 u[IU]/mL (ref 0.35–4.50)

## 2017-10-09 LAB — PSA: PSA: 3.36 ng/mL (ref 0.10–4.00)

## 2017-10-09 NOTE — Progress Notes (Signed)
   Subjective:    Patient ID: Bryan Hurst, male    DOB: 04/25/1955, 62 y.o.   MRN: [id number]  HPI Here for a well exam. He feels great. He has put on a little weight because he has not been playing as much tennis as he used to due to some tennis elbow.    Review of Systems  Constitutional: Negative.   HENT: Negative.   Eyes: Negative.   Respiratory: Negative.   Cardiovascular: Negative.   Gastrointestinal: Negative.   Genitourinary: Negative.   Musculoskeletal: Negative.   Skin: Negative.   Neurological: Negative.   Psychiatric/Behavioral: Negative.        Objective:   Physical Exam  Constitutional: He is oriented to person, place, and time. He appears well-developed and well-nourished. No distress.  HENT:  Head: Normocephalic and atraumatic.  Right Ear: External ear normal.  Left Ear: External ear normal.  Nose: Nose normal.  Mouth/Throat: Oropharynx is clear and moist. No oropharyngeal exudate.  Eyes: Pupils are equal, round, and reactive to light. Conjunctivae and EOM are normal. Right eye exhibits no discharge. Left eye exhibits no discharge. No scleral icterus.  Neck: Neck supple. No JVD present. No tracheal deviation present. No thyromegaly present.  Cardiovascular: Normal rate, regular rhythm, normal heart sounds and intact distal pulses. Exam reveals no gallop and no friction rub.  No murmur heard. Pulmonary/Chest: Effort normal and breath sounds normal. No respiratory distress. He has no wheezes. He has no rales. He exhibits no tenderness.  Abdominal: Soft. Bowel sounds are normal. He exhibits no distension and no mass. There is no tenderness. There is no rebound and no guarding.  Genitourinary: Rectum normal, prostate normal and penis normal. Rectal exam shows guaiac negative stool. No penile tenderness.  Musculoskeletal: Normal range of motion. He exhibits no edema or tenderness.  Lymphadenopathy:    He has no cervical adenopathy.  Neurological: He is alert and  oriented to person, place, and time. He has normal reflexes. He displays normal reflexes. No cranial nerve deficit. He exhibits normal muscle tone. Coordination normal.  Skin: Skin is warm and dry. No rash noted. He is not diaphoretic. No erythema. No pallor.  Psychiatric: He has a normal mood and affect. His behavior is normal. Judgment and thought content normal.          Assessment & Plan:  Well exam. We discussed diet and exercise. Get fasting labs.  Gershon Crane, MD

## 2017-10-30 DIAGNOSIS — M9901 Segmental and somatic dysfunction of cervical region: Secondary | ICD-10-CM | POA: Diagnosis not present

## 2017-10-30 DIAGNOSIS — M7712 Lateral epicondylitis, left elbow: Secondary | ICD-10-CM | POA: Diagnosis not present

## 2017-10-30 DIAGNOSIS — M9902 Segmental and somatic dysfunction of thoracic region: Secondary | ICD-10-CM | POA: Diagnosis not present

## 2017-11-04 DIAGNOSIS — H2513 Age-related nuclear cataract, bilateral: Secondary | ICD-10-CM | POA: Diagnosis not present

## 2017-12-04 DIAGNOSIS — M9902 Segmental and somatic dysfunction of thoracic region: Secondary | ICD-10-CM | POA: Diagnosis not present

## 2017-12-04 DIAGNOSIS — M9901 Segmental and somatic dysfunction of cervical region: Secondary | ICD-10-CM | POA: Diagnosis not present

## 2017-12-04 DIAGNOSIS — M7712 Lateral epicondylitis, left elbow: Secondary | ICD-10-CM | POA: Diagnosis not present

## 2018-01-17 DIAGNOSIS — M9902 Segmental and somatic dysfunction of thoracic region: Secondary | ICD-10-CM | POA: Diagnosis not present

## 2018-01-17 DIAGNOSIS — M7712 Lateral epicondylitis, left elbow: Secondary | ICD-10-CM | POA: Diagnosis not present

## 2018-01-17 DIAGNOSIS — M9901 Segmental and somatic dysfunction of cervical region: Secondary | ICD-10-CM | POA: Diagnosis not present

## 2018-02-19 DIAGNOSIS — M9901 Segmental and somatic dysfunction of cervical region: Secondary | ICD-10-CM | POA: Diagnosis not present

## 2018-02-19 DIAGNOSIS — M9902 Segmental and somatic dysfunction of thoracic region: Secondary | ICD-10-CM | POA: Diagnosis not present

## 2018-02-19 DIAGNOSIS — M7712 Lateral epicondylitis, left elbow: Secondary | ICD-10-CM | POA: Diagnosis not present

## 2018-03-21 DIAGNOSIS — M9902 Segmental and somatic dysfunction of thoracic region: Secondary | ICD-10-CM | POA: Diagnosis not present

## 2018-03-21 DIAGNOSIS — M7712 Lateral epicondylitis, left elbow: Secondary | ICD-10-CM | POA: Diagnosis not present

## 2018-03-21 DIAGNOSIS — M9901 Segmental and somatic dysfunction of cervical region: Secondary | ICD-10-CM | POA: Diagnosis not present

## 2018-08-01 IMAGING — DX DG FINGER LITTLE 2+V*L*
3 series · 3 of 3 positions shown · non-contrast
Comparison: None.

CLINICAL DATA: Injury several weeks prior.  Limited range of motion

EXAM:
LEFT FIFTH FINGER 2+V

[finger pa]
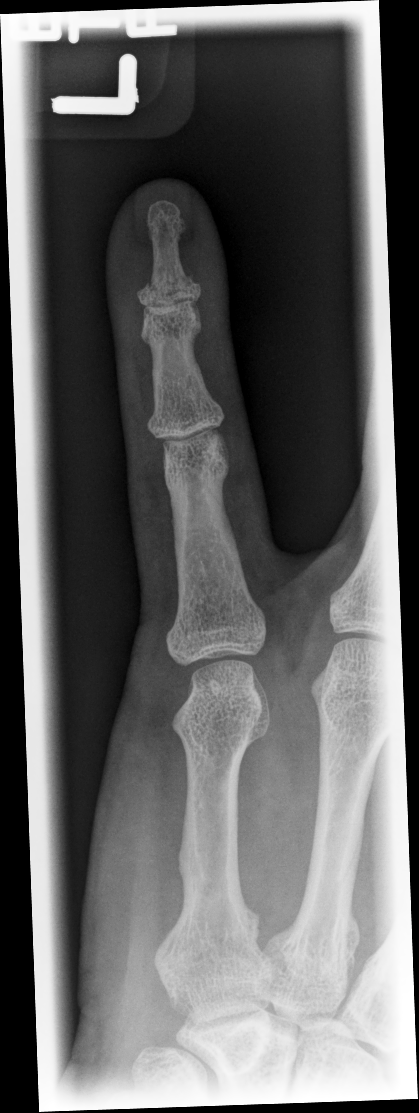

[finger oblique]
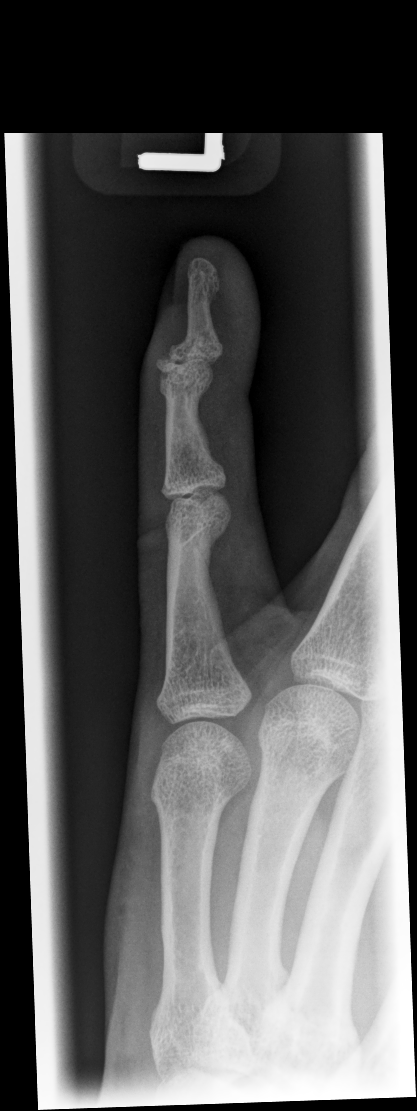

[finger lat]
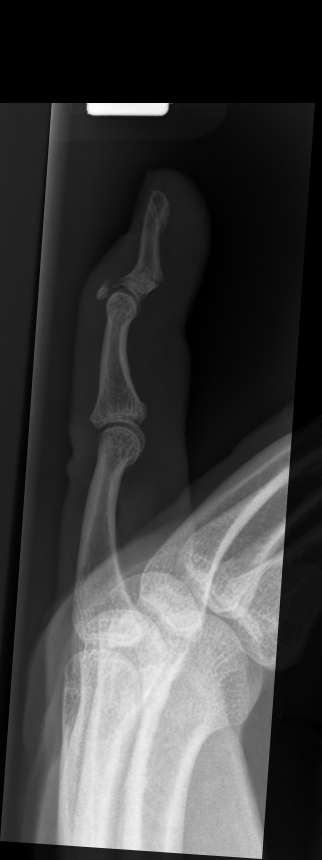

[3 of 3 positions shown; findings below may reference images not displayed]

FINDINGS: Frontal, oblique, and lateral views were obtained. There is an
avulsion type fracture along the dorsal medial aspect of the
proximal portion of the fifth distal phalanx. A fracture fragment is
noted in the dorsal aspect of the fifth DIP joint. There is a
transversely oriented fracture along the proximal aspect of the
fifth distal phalanx as well. There is slight dorsal angulation
distally in this area. No other fractures are evident. No
dislocation. Joint spaces appear normal. No erosive change. There is
soft tissue swelling in the fifth DIP joint.
IMPRESSION: A avulsion type fracture along the medial dorsal aspect of the fifth
distal phalanx with fracture fragment in the dorsal aspect of the
fifth DIP joint. Transversely oriented fracture proximal aspect
fifth distal phalanx with mild dorsal angulation distally. No other
fractures. No dislocations. No appreciable arthropathy.

These results will be called to the ordering clinician or
representative by the Radiologist Assistant, and communication
documented in the PACS or zVision Dashboard.

## 2018-08-02 ENCOUNTER — Other Ambulatory Visit: Payer: Self-pay | Admitting: Chiropractic Medicine

## 2018-08-02 DIAGNOSIS — M25522 Pain in left elbow: Secondary | ICD-10-CM

## 2018-08-17 ENCOUNTER — Ambulatory Visit
Admission: RE | Admit: 2018-08-17 | Discharge: 2018-08-17 | Disposition: A | Discharge status: Home / Self Care | Payer: 59 | Source: Ambulatory Visit | Attending: Chiropractic Medicine | Admitting: Chiropractic Medicine

## 2018-08-17 ENCOUNTER — Other Ambulatory Visit: Payer: Self-pay

## 2018-08-17 DIAGNOSIS — M25522 Pain in left elbow: Secondary | ICD-10-CM

## 2019-01-26 ENCOUNTER — Ambulatory Visit: Payer: 59 | Attending: Internal Medicine

## 2019-01-26 DIAGNOSIS — Z20822 Contact with and (suspected) exposure to covid-19: Secondary | ICD-10-CM

## 2019-01-28 LAB — NOVEL CORONAVIRUS, NAA: SARS-CoV-2, NAA: NOT DETECTED

## 2019-03-10 ENCOUNTER — Other Ambulatory Visit: Payer: Self-pay

## 2019-03-11 ENCOUNTER — Ambulatory Visit (INDEPENDENT_AMBULATORY_CARE_PROVIDER_SITE_OTHER): Payer: 59 | Admitting: Family Medicine

## 2019-03-11 ENCOUNTER — Encounter: Payer: Self-pay | Admitting: Family Medicine

## 2019-03-11 VITALS — BP 140/82 | HR 81 | Temp 98.1°F | Ht 74.0 in | Wt 210.0 lb

## 2019-03-11 DIAGNOSIS — Z0000 Encounter for general adult medical examination without abnormal findings: Secondary | ICD-10-CM

## 2019-03-11 DIAGNOSIS — Z23 Encounter for immunization: Secondary | ICD-10-CM | POA: Diagnosis not present

## 2019-03-11 LAB — LIPID PANEL
Cholesterol: 200 mg/dL (ref 0–200)
HDL: 40.7 mg/dL (ref >39.00)
LDL Cholesterol: 128 mg/dL — ABNORMAL HIGH (ref 0–99)
NonHDL: 159.32
Total CHOL/HDL Ratio: 5
Triglycerides: 159 mg/dL — ABNORMAL HIGH (ref 0.0–149.0)
VLDL: 31.8 mg/dL (ref 0.0–40.0)

## 2019-03-11 LAB — TSH: TSH: 0.7 u[IU]/mL (ref 0.35–4.50)

## 2019-03-11 LAB — BASIC METABOLIC PANEL
BUN: 20 mg/dL (ref 6–23)
CO2: 29 mEq/L (ref 19–32)
Calcium: 9.8 mg/dL (ref 8.4–10.5)
Chloride: 105 mEq/L (ref 96–112)
Creatinine, Ser: 1.06 mg/dL (ref 0.40–1.50)
GFR: 70.57 mL/min (ref >60.00)
Glucose, Bld: 98 mg/dL (ref 70–99)
Potassium: 4.7 mEq/L (ref 3.5–5.1)
Sodium: 141 mEq/L (ref 135–145)

## 2019-03-11 LAB — CBC WITH DIFFERENTIAL/PLATELET
Basophils Absolute: 0.1 10*3/uL (ref 0.0–0.1)
Basophils Relative: 0.8 % (ref 0.0–3.0)
Eosinophils Absolute: 0.1 10*3/uL (ref 0.0–0.7)
Eosinophils Relative: 2 % (ref 0.0–5.0)
HCT: 48.4 % (ref 39.0–52.0)
Hemoglobin: 16.3 g/dL (ref 13.0–17.0)
Lymphocytes Relative: 22.4 % (ref 12.0–46.0)
Lymphs Abs: 1.6 10*3/uL (ref 0.7–4.0)
MCHC: 33.7 g/dL (ref 30.0–36.0)
MCV: 90.5 fl (ref 78.0–100.0)
Monocytes Absolute: 0.5 10*3/uL (ref 0.1–1.0)
Monocytes Relative: 6.5 % (ref 3.0–12.0)
Neutro Abs: 4.9 10*3/uL (ref 1.4–7.7)
Neutrophils Relative %: 68.3 % (ref 43.0–77.0)
Platelets: 181 10*3/uL (ref 150.0–400.0)
RBC: 5.35 Mil/uL (ref 4.22–5.81)
RDW: 13.5 % (ref 11.5–15.5)
WBC: 7.2 10*3/uL (ref 4.0–10.5)

## 2019-03-11 LAB — HEPATIC FUNCTION PANEL
ALT: 26 U/L (ref 0–53)
AST: 22 U/L (ref 0–37)
Albumin: 4.5 g/dL (ref 3.5–5.2)
Alkaline Phosphatase: 69 U/L (ref 39–117)
Bilirubin, Direct: 0.1 mg/dL (ref 0.0–0.3)
Total Bilirubin: 0.8 mg/dL (ref 0.2–1.2)
Total Protein: 7 g/dL (ref 6.0–8.3)

## 2019-03-11 LAB — PSA: PSA: 2.61 ng/mL (ref 0.10–4.00)

## 2019-03-11 NOTE — Patient Instructions (Signed)
Health Maintenance Due  Topic Date Due  . Hepatitis C Screening  12-26-1955  . HIV Screening  04/01/1971  . TETANUS/TDAP  04/01/1975  . INFLUENZA VACCINE  08/16/2018    Depression screen PHQ 2/9 10/09/2017  Decreased Interest 0  Down, Depressed, Hopeless 0  PHQ - 2 Score 0

## 2019-03-11 NOTE — Progress Notes (Signed)
   Subjective:    Patient ID: Bryan Hurst, male    DOB: November 25, 1955, 64 y.o.   MRN: [id number]  HPI Here for a well exam. He feels fine. He looks forward to playing tennis again now that the weather is warming up.    Review of Systems  Constitutional: Negative.   HENT: Negative.   Eyes: Negative.   Respiratory: Negative.   Cardiovascular: Negative.   Gastrointestinal: Negative.   Genitourinary: Negative.   Musculoskeletal: Negative.   Skin: Negative.   Neurological: Negative.   Psychiatric/Behavioral: Negative.        Objective:   Physical Exam Constitutional:      General: He is not in acute distress.    Appearance: He is well-developed. He is not diaphoretic.  HENT:     Head: Normocephalic and atraumatic.     Right Ear: External ear normal.     Left Ear: External ear normal.     Nose: Nose normal.     Mouth/Throat:     Pharynx: No oropharyngeal exudate.  Eyes:     General: No scleral icterus.       Right eye: No discharge.        Left eye: No discharge.     Conjunctiva/sclera: Conjunctivae normal.     Pupils: Pupils are equal, round, and reactive to light.  Neck:     Thyroid: No thyromegaly.     Vascular: No JVD.     Trachea: No tracheal deviation.  Cardiovascular:     Rate and Rhythm: Normal rate and regular rhythm.     Heart sounds: Normal heart sounds. No murmur. No friction rub. No gallop.   Pulmonary:     Effort: Pulmonary effort is normal. No respiratory distress.     Breath sounds: Normal breath sounds. No wheezing or rales.  Chest:     Chest wall: No tenderness.  Abdominal:     General: Bowel sounds are normal. There is no distension.     Palpations: Abdomen is soft. There is no mass.     Tenderness: There is no abdominal tenderness. There is no guarding or rebound.  Genitourinary:    Penis: Normal. No tenderness.      Testes: Normal.     Prostate: Normal.     Rectum: Normal. Guaiac result negative.  Musculoskeletal:        General: No  tenderness. Normal range of motion.     Cervical back: Neck supple.  Lymphadenopathy:     Cervical: No cervical adenopathy.  Skin:    General: Skin is warm and dry.     Coloration: Skin is not pale.     Findings: No erythema or rash.  Neurological:     Mental Status: He is alert and oriented to person, place, and time.     Cranial Nerves: No cranial nerve deficit.     Motor: No abnormal muscle tone.     Coordination: Coordination normal.     Deep Tendon Reflexes: Reflexes are normal and symmetric. Reflexes normal.  Psychiatric:        Behavior: Behavior normal.        Thought Content: Thought content normal.        Judgment: Judgment normal.           Assessment & Plan:  Well exam. We discussed diet and exercise. Get fasting labs.  Gershon Crane, MD

## 2019-12-30 DIAGNOSIS — M9901 Segmental and somatic dysfunction of cervical region: Secondary | ICD-10-CM | POA: Diagnosis not present

## 2019-12-30 DIAGNOSIS — M7912 Myalgia of auxiliary muscles, head and neck: Secondary | ICD-10-CM | POA: Diagnosis not present

## 2019-12-30 DIAGNOSIS — M7712 Lateral epicondylitis, left elbow: Secondary | ICD-10-CM | POA: Diagnosis not present

## 2019-12-30 DIAGNOSIS — M9902 Segmental and somatic dysfunction of thoracic region: Secondary | ICD-10-CM | POA: Diagnosis not present

## 2020-01-29 DIAGNOSIS — M9901 Segmental and somatic dysfunction of cervical region: Secondary | ICD-10-CM | POA: Diagnosis not present

## 2020-01-29 DIAGNOSIS — M7912 Myalgia of auxiliary muscles, head and neck: Secondary | ICD-10-CM | POA: Diagnosis not present

## 2020-01-29 DIAGNOSIS — M9902 Segmental and somatic dysfunction of thoracic region: Secondary | ICD-10-CM | POA: Diagnosis not present

## 2020-01-29 DIAGNOSIS — M7712 Lateral epicondylitis, left elbow: Secondary | ICD-10-CM | POA: Diagnosis not present

## 2020-02-26 DIAGNOSIS — M9901 Segmental and somatic dysfunction of cervical region: Secondary | ICD-10-CM | POA: Diagnosis not present

## 2020-02-26 DIAGNOSIS — M7912 Myalgia of auxiliary muscles, head and neck: Secondary | ICD-10-CM | POA: Diagnosis not present

## 2020-02-26 DIAGNOSIS — M7712 Lateral epicondylitis, left elbow: Secondary | ICD-10-CM | POA: Diagnosis not present

## 2020-02-26 DIAGNOSIS — M9902 Segmental and somatic dysfunction of thoracic region: Secondary | ICD-10-CM | POA: Diagnosis not present

## 2020-03-23 DIAGNOSIS — M7912 Myalgia of auxiliary muscles, head and neck: Secondary | ICD-10-CM | POA: Diagnosis not present

## 2020-03-23 DIAGNOSIS — M9902 Segmental and somatic dysfunction of thoracic region: Secondary | ICD-10-CM | POA: Diagnosis not present

## 2020-03-23 DIAGNOSIS — M9901 Segmental and somatic dysfunction of cervical region: Secondary | ICD-10-CM | POA: Diagnosis not present

## 2020-03-23 DIAGNOSIS — M7712 Lateral epicondylitis, left elbow: Secondary | ICD-10-CM | POA: Diagnosis not present

## 2020-06-03 DIAGNOSIS — M9901 Segmental and somatic dysfunction of cervical region: Secondary | ICD-10-CM | POA: Diagnosis not present

## 2020-06-03 DIAGNOSIS — M7912 Myalgia of auxiliary muscles, head and neck: Secondary | ICD-10-CM | POA: Diagnosis not present

## 2020-06-03 DIAGNOSIS — M7712 Lateral epicondylitis, left elbow: Secondary | ICD-10-CM | POA: Diagnosis not present

## 2020-06-03 DIAGNOSIS — M9902 Segmental and somatic dysfunction of thoracic region: Secondary | ICD-10-CM | POA: Diagnosis not present

## 2020-07-08 DIAGNOSIS — M7712 Lateral epicondylitis, left elbow: Secondary | ICD-10-CM | POA: Diagnosis not present

## 2020-07-08 DIAGNOSIS — M9902 Segmental and somatic dysfunction of thoracic region: Secondary | ICD-10-CM | POA: Diagnosis not present

## 2020-07-08 DIAGNOSIS — M9901 Segmental and somatic dysfunction of cervical region: Secondary | ICD-10-CM | POA: Diagnosis not present

## 2020-07-08 DIAGNOSIS — M7912 Myalgia of auxiliary muscles, head and neck: Secondary | ICD-10-CM | POA: Diagnosis not present

## 2020-07-24 IMAGING — MR MRI OF THE LEFT ELBOW WITHOUT CONTRAST
7 series · 40 of 40 positions shown · non-contrast
Comparison: None.

CLINICAL DATA: Left elbow pain since an injury suffered
approximately 1 year ago playing tennis.

EXAM:
MRI OF THE LEFT ELBOW WITHOUT CONTRAST
TECHNIQUE: Multiplanar, multisequence MR imaging of the elbow was performed. No
intravenous contrast was administered.

[Series 3: T2 fat-sat · coronal · left · 3.0mm · 0.44mm/px · 4 of 21 slices shown (1 of 2)]
[im 1/21]
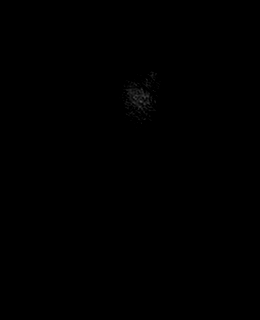
[im 7/21]
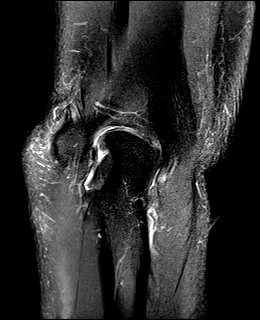
[im 14/21]
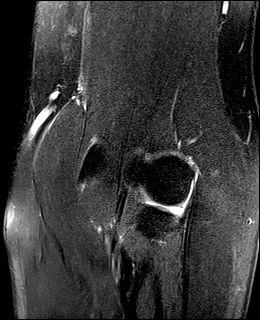
[im 21/21]
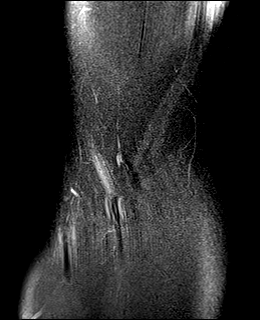

[Series 4: T1 · coronal · left · 3.0mm · 0.44mm/px · 5 of 21 slices shown (1 of 3)]
[im 1/21]
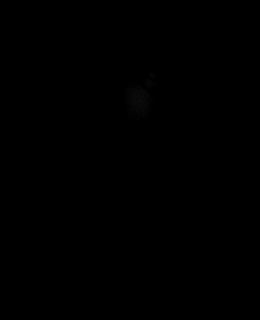
[im 6/21]
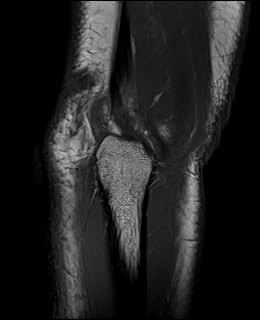
[im 11/21]
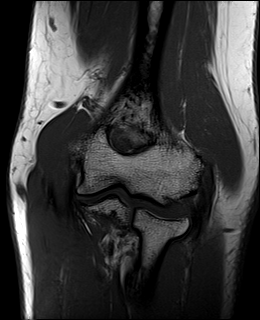
[im 16/21]
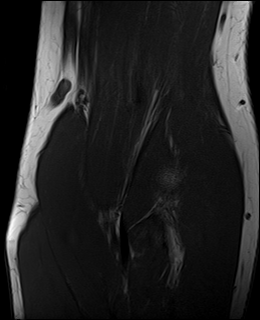
[im 21/21]
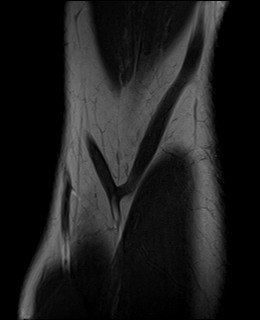

[Series 5: T1 · axial · left · 3.0mm · 0.41mm/px · z∈[-52,+59]mm · 7 of 30 slices shown (2 of 3)]
[im 1/30]
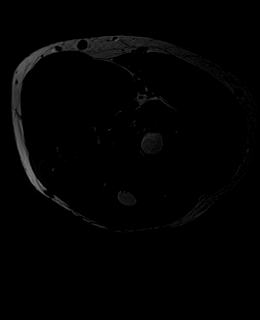
[im 5/30]
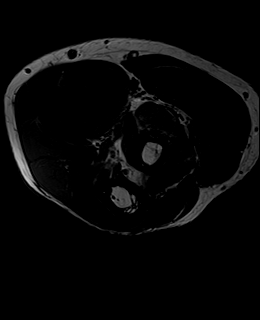
[im 10/30]
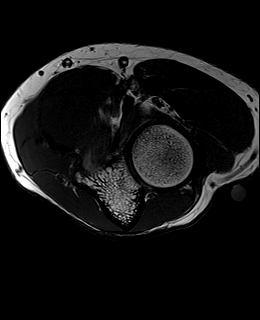
[im 15/30]
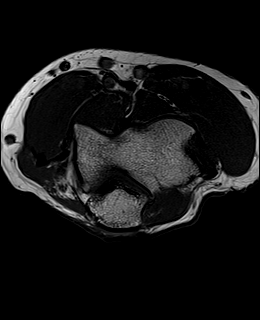
[im 20/30]
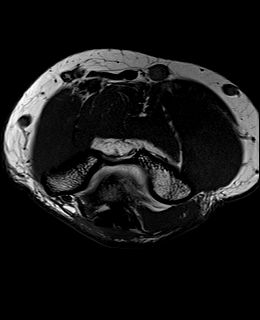
[im 25/30]
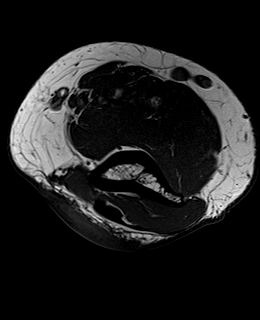
[im 30/30]
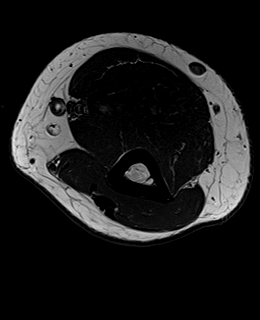

[Series 6: T2 fat-sat · axial · left · 3.0mm · 0.41mm/px · z∈[-52,+59]mm · 7 of 30 slices shown (2 of 2)]
[im 1/30]
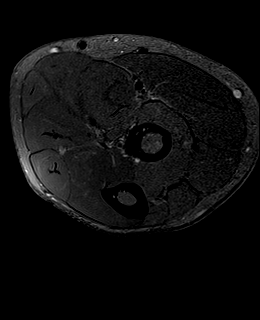
[im 5/30]
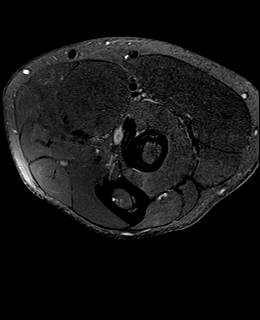
[im 10/30]
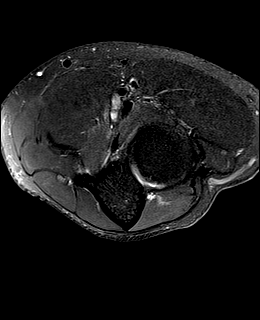
[im 15/30]
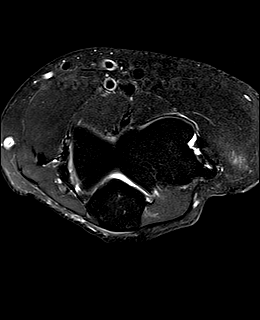
[im 20/30]
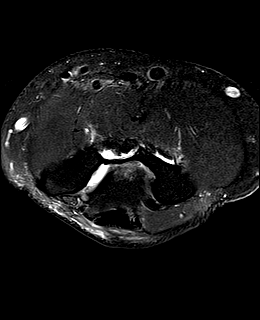
[im 25/30]
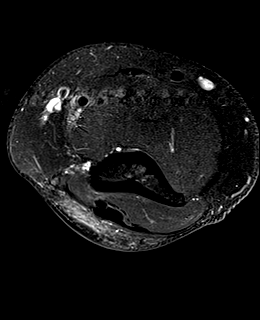
[im 30/30]
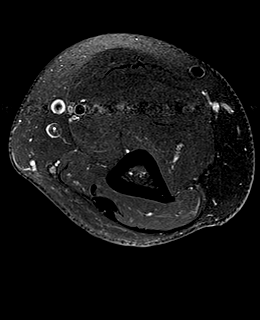

[Series 7: PD fat-sat · sagittal · left · 3.0mm · 0.44mm/px · 5 of 23 slices shown (1 of 2)]
[im 1/23]
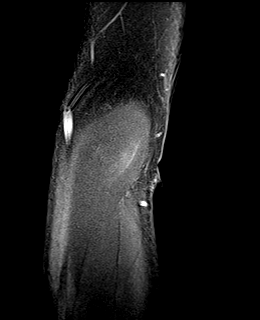
[im 6/23]
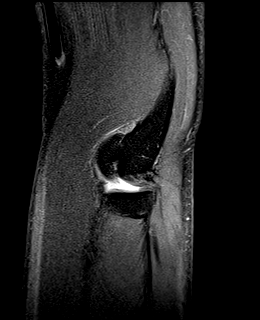
[im 12/23]
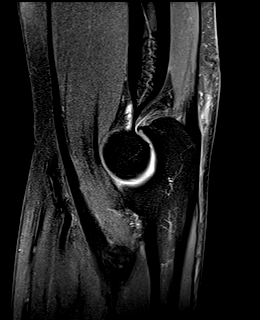
[im 17/23]
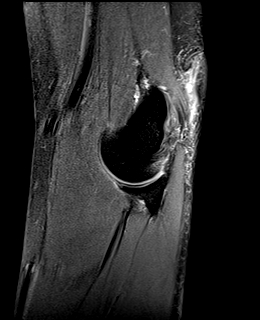
[im 23/23]
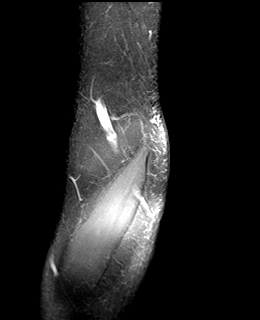

[Series 10: PD fat-sat · sagittal · left · 3.0mm · 0.50mm/px · 6 of 25 slices shown (2 of 2)]
[im 1/25]
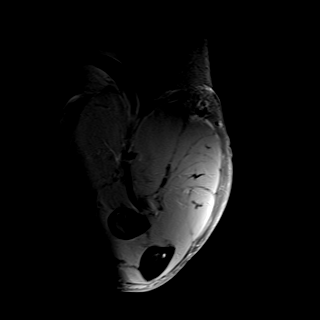
[im 5/25]
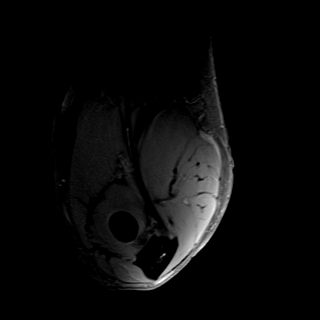
[im 10/25]
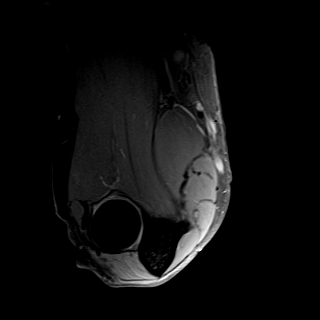
[im 15/25]
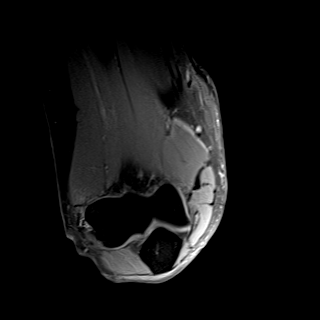
[im 20/25]
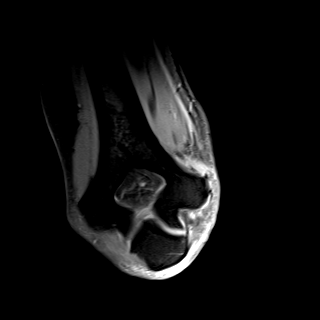
[im 25/25]
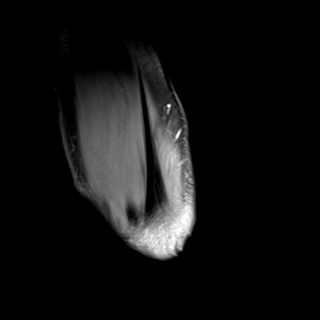

[Series 11: T1 · sagittal · left · 3.0mm · 0.50mm/px · 6 of 25 slices shown (3 of 3)]
[im 1/25]
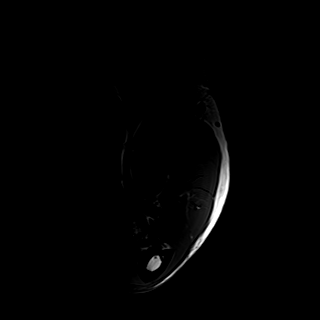
[im 5/25]
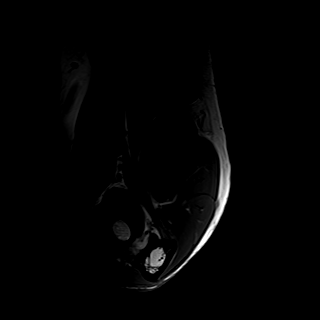
[im 10/25]
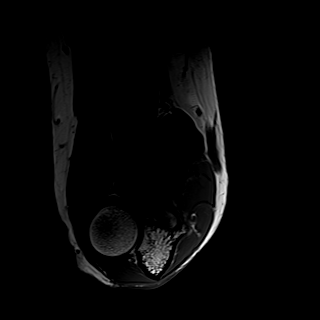
[im 15/25]
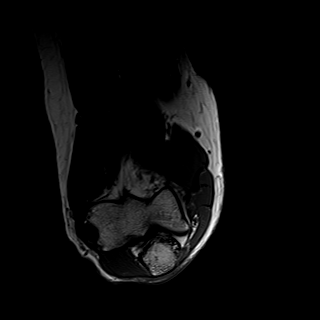
[im 20/25]
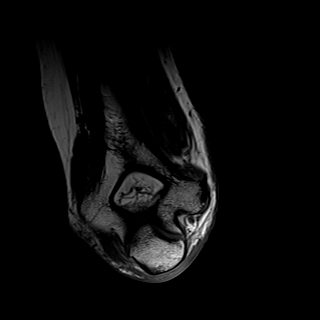
[im 25/25]
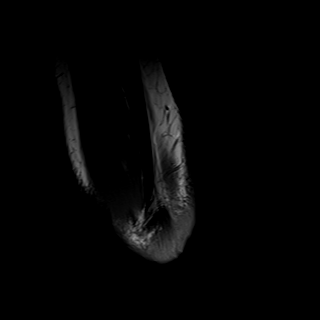

[40 of 40 positions shown; findings below may reference images not displayed]

FINDINGS: TENDONS

Common forearm flexor origin: Intact and normal in appearance.

Common forearm extensor origin: Intrasubstance increased T2 signal
is seen in the tendons of the common extensor origin consistent with
lateral epicondylitis. Small interstitial tear in the extensor carpi
radialis brevis is seen. No complete tear.

Biceps: Intact and normal in appearance.

Triceps: Intact and normal in appearance.

LIGAMENTS

Medial stabilizers: Intact.

Lateral stabilizers: Intact.

Cartilage: Normal.

Joint: Normal.  No effusion.

Cubital tunnel: Normal.

Bones: Normal marrow signal throughout. There is some osteophytosis
about the elbow.
IMPRESSION: Lateral epicondylitis with mild interstitial tear of the extensor
carpi radialis brevis.

Mild osteoarthritis about the elbow.

## 2020-08-03 DIAGNOSIS — M7912 Myalgia of auxiliary muscles, head and neck: Secondary | ICD-10-CM | POA: Diagnosis not present

## 2020-08-03 DIAGNOSIS — M7712 Lateral epicondylitis, left elbow: Secondary | ICD-10-CM | POA: Diagnosis not present

## 2020-08-03 DIAGNOSIS — M9901 Segmental and somatic dysfunction of cervical region: Secondary | ICD-10-CM | POA: Diagnosis not present

## 2020-08-03 DIAGNOSIS — M9902 Segmental and somatic dysfunction of thoracic region: Secondary | ICD-10-CM | POA: Diagnosis not present

## 2020-08-31 DIAGNOSIS — M7712 Lateral epicondylitis, left elbow: Secondary | ICD-10-CM | POA: Diagnosis not present

## 2020-08-31 DIAGNOSIS — M9902 Segmental and somatic dysfunction of thoracic region: Secondary | ICD-10-CM | POA: Diagnosis not present

## 2020-08-31 DIAGNOSIS — M7912 Myalgia of auxiliary muscles, head and neck: Secondary | ICD-10-CM | POA: Diagnosis not present

## 2020-08-31 DIAGNOSIS — M9901 Segmental and somatic dysfunction of cervical region: Secondary | ICD-10-CM | POA: Diagnosis not present

## 2020-09-28 DIAGNOSIS — M9902 Segmental and somatic dysfunction of thoracic region: Secondary | ICD-10-CM | POA: Diagnosis not present

## 2020-09-28 DIAGNOSIS — M7712 Lateral epicondylitis, left elbow: Secondary | ICD-10-CM | POA: Diagnosis not present

## 2020-09-28 DIAGNOSIS — M7912 Myalgia of auxiliary muscles, head and neck: Secondary | ICD-10-CM | POA: Diagnosis not present

## 2020-09-28 DIAGNOSIS — M9901 Segmental and somatic dysfunction of cervical region: Secondary | ICD-10-CM | POA: Diagnosis not present

## 2020-10-26 DIAGNOSIS — M9902 Segmental and somatic dysfunction of thoracic region: Secondary | ICD-10-CM | POA: Diagnosis not present

## 2020-10-26 DIAGNOSIS — M9901 Segmental and somatic dysfunction of cervical region: Secondary | ICD-10-CM | POA: Diagnosis not present

## 2020-10-26 DIAGNOSIS — M25522 Pain in left elbow: Secondary | ICD-10-CM | POA: Diagnosis not present

## 2020-10-26 DIAGNOSIS — M542 Cervicalgia: Secondary | ICD-10-CM | POA: Diagnosis not present

## 2020-10-26 DIAGNOSIS — M546 Pain in thoracic spine: Secondary | ICD-10-CM | POA: Diagnosis not present

## 2020-10-26 DIAGNOSIS — M7918 Myalgia, other site: Secondary | ICD-10-CM | POA: Diagnosis not present

## 2020-10-26 DIAGNOSIS — M7712 Lateral epicondylitis, left elbow: Secondary | ICD-10-CM | POA: Diagnosis not present

## 2020-11-08 ENCOUNTER — Other Ambulatory Visit: Payer: Self-pay

## 2020-11-09 ENCOUNTER — Ambulatory Visit (INDEPENDENT_AMBULATORY_CARE_PROVIDER_SITE_OTHER): Payer: BC Managed Care – PPO | Admitting: Family Medicine

## 2020-11-09 ENCOUNTER — Encounter: Payer: Self-pay | Admitting: Family Medicine

## 2020-11-09 VITALS — BP 120/80 | HR 70 | Temp 97.7°F | Ht 74.0 in | Wt 206.0 lb

## 2020-11-09 DIAGNOSIS — Z0000 Encounter for general adult medical examination without abnormal findings: Secondary | ICD-10-CM

## 2020-11-09 LAB — LIPID PANEL
Cholesterol: 188 mg/dL (ref 0–200)
HDL: 40.6 mg/dL
LDL Cholesterol: 119 mg/dL — ABNORMAL HIGH (ref 0–99)
NonHDL: 147.52
Total CHOL/HDL Ratio: 5
Triglycerides: 141 mg/dL (ref 0.0–149.0)
VLDL: 28.2 mg/dL (ref 0.0–40.0)

## 2020-11-09 LAB — HEPATIC FUNCTION PANEL
ALT: 24 U/L (ref 0–53)
AST: 22 U/L (ref 0–37)
Albumin: 4.7 g/dL (ref 3.5–5.2)
Alkaline Phosphatase: 68 U/L (ref 39–117)
Bilirubin, Direct: 0.2 mg/dL (ref 0.0–0.3)
Total Bilirubin: 1.3 mg/dL — ABNORMAL HIGH (ref 0.2–1.2)
Total Protein: 7.1 g/dL (ref 6.0–8.3)

## 2020-11-09 LAB — CBC WITH DIFFERENTIAL/PLATELET
Basophils Absolute: 0.1 10*3/uL (ref 0.0–0.1)
Basophils Relative: 0.8 % (ref 0.0–3.0)
Eosinophils Absolute: 0.2 10*3/uL (ref 0.0–0.7)
Eosinophils Relative: 2.6 % (ref 0.0–5.0)
HCT: 47.9 % (ref 39.0–52.0)
Hemoglobin: 16.3 g/dL (ref 13.0–17.0)
Lymphocytes Relative: 27.8 % (ref 12.0–46.0)
Lymphs Abs: 1.9 10*3/uL (ref 0.7–4.0)
MCHC: 34.1 g/dL (ref 30.0–36.0)
MCV: 89 fl (ref 78.0–100.0)
Monocytes Absolute: 0.6 10*3/uL (ref 0.1–1.0)
Monocytes Relative: 9 % (ref 3.0–12.0)
Neutro Abs: 4.1 10*3/uL (ref 1.4–7.7)
Neutrophils Relative %: 59.8 % (ref 43.0–77.0)
Platelets: 186 10*3/uL (ref 150.0–400.0)
RBC: 5.39 Mil/uL (ref 4.22–5.81)
RDW: 13.2 % (ref 11.5–15.5)
WBC: 6.9 10*3/uL (ref 4.0–10.5)

## 2020-11-09 LAB — BASIC METABOLIC PANEL
BUN: 21 mg/dL (ref 6–23)
CO2: 30 mEq/L (ref 19–32)
Calcium: 9.4 mg/dL (ref 8.4–10.5)
Chloride: 103 mEq/L (ref 96–112)
Creatinine, Ser: 1.17 mg/dL (ref 0.40–1.50)
GFR: 65.83 mL/min (ref >60.00)
Glucose, Bld: 95 mg/dL (ref 70–99)
Potassium: 4.2 mEq/L (ref 3.5–5.1)
Sodium: 139 mEq/L (ref 135–145)

## 2020-11-09 LAB — PSA: PSA: 2.48 ng/mL (ref 0.10–4.00)

## 2020-11-09 LAB — HEMOGLOBIN A1C: Hgb A1c MFr Bld: 5.4 % (ref 4.6–6.5)

## 2020-11-09 LAB — TSH: TSH: 1.12 u[IU]/mL (ref 0.35–5.50)

## 2020-11-09 NOTE — Progress Notes (Signed)
   Subjective:    Patient ID: Bryan Hurst, male    DOB: 1955-03-09, 65 y.o.   MRN: [id number]  HPI Here for a well exam. He feels fine. He still plays tennis several days a week. He says his only weakness is his tendency to eat sugary snacks when he is stressed.    Review of Systems  Constitutional: Negative.   HENT: Negative.    Eyes: Negative.   Respiratory: Negative.    Cardiovascular: Negative.   Gastrointestinal: Negative.   Genitourinary: Negative.   Musculoskeletal: Negative.   Skin: Negative.   Neurological: Negative.   Psychiatric/Behavioral: Negative.        Objective:   Physical Exam Constitutional:      General: He is not in acute distress.    Appearance: Normal appearance. He is well-developed. He is not diaphoretic.  HENT:     Head: Normocephalic and atraumatic.     Right Ear: External ear normal.     Left Ear: External ear normal.     Nose: Nose normal.     Mouth/Throat:     Pharynx: No oropharyngeal exudate.  Eyes:     General: No scleral icterus.       Right eye: No discharge.        Left eye: No discharge.     Conjunctiva/sclera: Conjunctivae normal.     Pupils: Pupils are equal, round, and reactive to light.  Neck:     Thyroid: No thyromegaly.     Vascular: No JVD.     Trachea: No tracheal deviation.  Cardiovascular:     Rate and Rhythm: Normal rate and regular rhythm.     Heart sounds: Normal heart sounds. No murmur heard.   No friction rub. No gallop.  Pulmonary:     Effort: Pulmonary effort is normal. No respiratory distress.     Breath sounds: Normal breath sounds. No wheezing or rales.  Chest:     Chest wall: No tenderness.  Abdominal:     General: Bowel sounds are normal. There is no distension.     Palpations: Abdomen is soft. There is no mass.     Tenderness: There is no abdominal tenderness. There is no guarding or rebound.  Genitourinary:    Penis: Normal. No tenderness.      Testes: Normal.     Prostate: Normal.     Rectum:  Normal. Guaiac result negative.  Musculoskeletal:        General: No tenderness. Normal range of motion.     Cervical back: Neck supple.  Lymphadenopathy:     Cervical: No cervical adenopathy.  Skin:    General: Skin is warm and dry.     Coloration: Skin is not pale.     Findings: No erythema or rash.  Neurological:     Mental Status: He is alert and oriented to person, place, and time.     Cranial Nerves: No cranial nerve deficit.     Motor: No abnormal muscle tone.     Coordination: Coordination normal.     Deep Tendon Reflexes: Reflexes are normal and symmetric. Reflexes normal.  Psychiatric:        Behavior: Behavior normal.        Thought Content: Thought content normal.        Judgment: Judgment normal.          Assessment & Plan:  Well exam. We discussed diet and exercise. Get fasting labs. Gershon Crane, MD

## 2020-11-23 DIAGNOSIS — M542 Cervicalgia: Secondary | ICD-10-CM | POA: Diagnosis not present

## 2020-11-23 DIAGNOSIS — M7918 Myalgia, other site: Secondary | ICD-10-CM | POA: Diagnosis not present

## 2020-11-23 DIAGNOSIS — M546 Pain in thoracic spine: Secondary | ICD-10-CM | POA: Diagnosis not present

## 2020-11-23 DIAGNOSIS — M7712 Lateral epicondylitis, left elbow: Secondary | ICD-10-CM | POA: Diagnosis not present

## 2020-11-23 DIAGNOSIS — M25522 Pain in left elbow: Secondary | ICD-10-CM | POA: Diagnosis not present

## 2020-11-23 DIAGNOSIS — M9902 Segmental and somatic dysfunction of thoracic region: Secondary | ICD-10-CM | POA: Diagnosis not present

## 2020-11-23 DIAGNOSIS — M9901 Segmental and somatic dysfunction of cervical region: Secondary | ICD-10-CM | POA: Diagnosis not present

## 2020-12-23 DIAGNOSIS — M9901 Segmental and somatic dysfunction of cervical region: Secondary | ICD-10-CM | POA: Diagnosis not present

## 2020-12-23 DIAGNOSIS — M9902 Segmental and somatic dysfunction of thoracic region: Secondary | ICD-10-CM | POA: Diagnosis not present

## 2020-12-23 DIAGNOSIS — M542 Cervicalgia: Secondary | ICD-10-CM | POA: Diagnosis not present

## 2020-12-23 DIAGNOSIS — M7712 Lateral epicondylitis, left elbow: Secondary | ICD-10-CM | POA: Diagnosis not present

## 2020-12-23 DIAGNOSIS — M25522 Pain in left elbow: Secondary | ICD-10-CM | POA: Diagnosis not present

## 2020-12-23 DIAGNOSIS — M546 Pain in thoracic spine: Secondary | ICD-10-CM | POA: Diagnosis not present

## 2020-12-23 DIAGNOSIS — M7918 Myalgia, other site: Secondary | ICD-10-CM | POA: Diagnosis not present

## 2021-01-06 DIAGNOSIS — H2513 Age-related nuclear cataract, bilateral: Secondary | ICD-10-CM | POA: Diagnosis not present

## 2021-01-06 DIAGNOSIS — H5213 Myopia, bilateral: Secondary | ICD-10-CM | POA: Diagnosis not present

## 2021-01-18 DIAGNOSIS — M9901 Segmental and somatic dysfunction of cervical region: Secondary | ICD-10-CM | POA: Diagnosis not present

## 2021-01-18 DIAGNOSIS — M7712 Lateral epicondylitis, left elbow: Secondary | ICD-10-CM | POA: Diagnosis not present

## 2021-01-18 DIAGNOSIS — M7918 Myalgia, other site: Secondary | ICD-10-CM | POA: Diagnosis not present

## 2021-01-18 DIAGNOSIS — M9902 Segmental and somatic dysfunction of thoracic region: Secondary | ICD-10-CM | POA: Diagnosis not present

## 2021-02-13 DIAGNOSIS — M7918 Myalgia, other site: Secondary | ICD-10-CM | POA: Diagnosis not present

## 2021-02-13 DIAGNOSIS — M7712 Lateral epicondylitis, left elbow: Secondary | ICD-10-CM | POA: Diagnosis not present

## 2021-02-13 DIAGNOSIS — M9901 Segmental and somatic dysfunction of cervical region: Secondary | ICD-10-CM | POA: Diagnosis not present

## 2021-02-13 DIAGNOSIS — M9902 Segmental and somatic dysfunction of thoracic region: Secondary | ICD-10-CM | POA: Diagnosis not present

## 2021-03-13 DIAGNOSIS — M7918 Myalgia, other site: Secondary | ICD-10-CM | POA: Diagnosis not present

## 2021-03-13 DIAGNOSIS — M9902 Segmental and somatic dysfunction of thoracic region: Secondary | ICD-10-CM | POA: Diagnosis not present

## 2021-03-13 DIAGNOSIS — M7712 Lateral epicondylitis, left elbow: Secondary | ICD-10-CM | POA: Diagnosis not present

## 2021-03-13 DIAGNOSIS — M9901 Segmental and somatic dysfunction of cervical region: Secondary | ICD-10-CM | POA: Diagnosis not present

## 2021-04-10 DIAGNOSIS — M7918 Myalgia, other site: Secondary | ICD-10-CM | POA: Diagnosis not present

## 2021-04-10 DIAGNOSIS — M7712 Lateral epicondylitis, left elbow: Secondary | ICD-10-CM | POA: Diagnosis not present

## 2021-04-10 DIAGNOSIS — M9901 Segmental and somatic dysfunction of cervical region: Secondary | ICD-10-CM | POA: Diagnosis not present

## 2021-04-10 DIAGNOSIS — M9902 Segmental and somatic dysfunction of thoracic region: Secondary | ICD-10-CM | POA: Diagnosis not present

## 2021-05-15 DIAGNOSIS — M7712 Lateral epicondylitis, left elbow: Secondary | ICD-10-CM | POA: Diagnosis not present

## 2021-05-15 DIAGNOSIS — M9902 Segmental and somatic dysfunction of thoracic region: Secondary | ICD-10-CM | POA: Diagnosis not present

## 2021-05-15 DIAGNOSIS — M9901 Segmental and somatic dysfunction of cervical region: Secondary | ICD-10-CM | POA: Diagnosis not present

## 2021-05-15 DIAGNOSIS — M7918 Myalgia, other site: Secondary | ICD-10-CM | POA: Diagnosis not present

## 2021-06-09 DIAGNOSIS — M7918 Myalgia, other site: Secondary | ICD-10-CM | POA: Diagnosis not present

## 2021-06-09 DIAGNOSIS — M7712 Lateral epicondylitis, left elbow: Secondary | ICD-10-CM | POA: Diagnosis not present

## 2021-06-09 DIAGNOSIS — M9902 Segmental and somatic dysfunction of thoracic region: Secondary | ICD-10-CM | POA: Diagnosis not present

## 2021-06-09 DIAGNOSIS — M9901 Segmental and somatic dysfunction of cervical region: Secondary | ICD-10-CM | POA: Diagnosis not present

## 2021-07-10 DIAGNOSIS — M9901 Segmental and somatic dysfunction of cervical region: Secondary | ICD-10-CM | POA: Diagnosis not present

## 2021-07-10 DIAGNOSIS — M9902 Segmental and somatic dysfunction of thoracic region: Secondary | ICD-10-CM | POA: Diagnosis not present

## 2021-07-10 DIAGNOSIS — M7918 Myalgia, other site: Secondary | ICD-10-CM | POA: Diagnosis not present

## 2021-07-10 DIAGNOSIS — M7712 Lateral epicondylitis, left elbow: Secondary | ICD-10-CM | POA: Diagnosis not present

## 2021-08-11 DIAGNOSIS — M9902 Segmental and somatic dysfunction of thoracic region: Secondary | ICD-10-CM | POA: Diagnosis not present

## 2021-08-11 DIAGNOSIS — M9901 Segmental and somatic dysfunction of cervical region: Secondary | ICD-10-CM | POA: Diagnosis not present

## 2021-08-11 DIAGNOSIS — M7712 Lateral epicondylitis, left elbow: Secondary | ICD-10-CM | POA: Diagnosis not present

## 2021-08-11 DIAGNOSIS — M7918 Myalgia, other site: Secondary | ICD-10-CM | POA: Diagnosis not present

## 2021-09-12 DIAGNOSIS — M9901 Segmental and somatic dysfunction of cervical region: Secondary | ICD-10-CM | POA: Diagnosis not present

## 2021-09-12 DIAGNOSIS — M9902 Segmental and somatic dysfunction of thoracic region: Secondary | ICD-10-CM | POA: Diagnosis not present

## 2021-09-12 DIAGNOSIS — M7712 Lateral epicondylitis, left elbow: Secondary | ICD-10-CM | POA: Diagnosis not present

## 2021-09-12 DIAGNOSIS — M7918 Myalgia, other site: Secondary | ICD-10-CM | POA: Diagnosis not present

## 2021-10-09 DIAGNOSIS — M7918 Myalgia, other site: Secondary | ICD-10-CM | POA: Diagnosis not present

## 2021-10-09 DIAGNOSIS — M9902 Segmental and somatic dysfunction of thoracic region: Secondary | ICD-10-CM | POA: Diagnosis not present

## 2021-10-09 DIAGNOSIS — M9901 Segmental and somatic dysfunction of cervical region: Secondary | ICD-10-CM | POA: Diagnosis not present

## 2021-10-09 DIAGNOSIS — M7712 Lateral epicondylitis, left elbow: Secondary | ICD-10-CM | POA: Diagnosis not present

## 2021-11-10 DIAGNOSIS — M7918 Myalgia, other site: Secondary | ICD-10-CM | POA: Diagnosis not present

## 2021-11-10 DIAGNOSIS — M9901 Segmental and somatic dysfunction of cervical region: Secondary | ICD-10-CM | POA: Diagnosis not present

## 2021-11-10 DIAGNOSIS — M7712 Lateral epicondylitis, left elbow: Secondary | ICD-10-CM | POA: Diagnosis not present

## 2021-11-10 DIAGNOSIS — M9902 Segmental and somatic dysfunction of thoracic region: Secondary | ICD-10-CM | POA: Diagnosis not present

## 2021-12-11 DIAGNOSIS — M7918 Myalgia, other site: Secondary | ICD-10-CM | POA: Diagnosis not present

## 2021-12-11 DIAGNOSIS — M9902 Segmental and somatic dysfunction of thoracic region: Secondary | ICD-10-CM | POA: Diagnosis not present

## 2021-12-11 DIAGNOSIS — M9901 Segmental and somatic dysfunction of cervical region: Secondary | ICD-10-CM | POA: Diagnosis not present

## 2021-12-11 DIAGNOSIS — M7712 Lateral epicondylitis, left elbow: Secondary | ICD-10-CM | POA: Diagnosis not present

## 2022-01-12 DIAGNOSIS — M9901 Segmental and somatic dysfunction of cervical region: Secondary | ICD-10-CM | POA: Diagnosis not present

## 2022-01-12 DIAGNOSIS — M7712 Lateral epicondylitis, left elbow: Secondary | ICD-10-CM | POA: Diagnosis not present

## 2022-01-12 DIAGNOSIS — M9902 Segmental and somatic dysfunction of thoracic region: Secondary | ICD-10-CM | POA: Diagnosis not present

## 2022-01-12 DIAGNOSIS — M7918 Myalgia, other site: Secondary | ICD-10-CM | POA: Diagnosis not present

## 2022-02-05 DIAGNOSIS — M9901 Segmental and somatic dysfunction of cervical region: Secondary | ICD-10-CM | POA: Diagnosis not present

## 2022-02-05 DIAGNOSIS — M9902 Segmental and somatic dysfunction of thoracic region: Secondary | ICD-10-CM | POA: Diagnosis not present

## 2022-02-05 DIAGNOSIS — M7918 Myalgia, other site: Secondary | ICD-10-CM | POA: Diagnosis not present

## 2022-02-05 DIAGNOSIS — M7712 Lateral epicondylitis, left elbow: Secondary | ICD-10-CM | POA: Diagnosis not present

## 2022-03-09 DIAGNOSIS — M7702 Medial epicondylitis, left elbow: Secondary | ICD-10-CM | POA: Diagnosis not present

## 2022-03-09 DIAGNOSIS — M9902 Segmental and somatic dysfunction of thoracic region: Secondary | ICD-10-CM | POA: Diagnosis not present

## 2022-03-09 DIAGNOSIS — M9906 Segmental and somatic dysfunction of lower extremity: Secondary | ICD-10-CM | POA: Diagnosis not present

## 2022-03-09 DIAGNOSIS — M9901 Segmental and somatic dysfunction of cervical region: Secondary | ICD-10-CM | POA: Diagnosis not present

## 2022-03-13 ENCOUNTER — Encounter: Payer: Self-pay | Admitting: Family Medicine

## 2022-03-13 ENCOUNTER — Ambulatory Visit: Payer: BC Managed Care – PPO | Admitting: Family Medicine

## 2022-03-13 VITALS — BP 140/92 | HR 62 | Temp 98.1°F | Wt 203.0 lb

## 2022-03-13 DIAGNOSIS — N401 Enlarged prostate with lower urinary tract symptoms: Secondary | ICD-10-CM

## 2022-03-13 DIAGNOSIS — E785 Hyperlipidemia, unspecified: Secondary | ICD-10-CM | POA: Diagnosis not present

## 2022-03-13 DIAGNOSIS — R42 Dizziness and giddiness: Secondary | ICD-10-CM | POA: Diagnosis not present

## 2022-03-13 DIAGNOSIS — R739 Hyperglycemia, unspecified: Secondary | ICD-10-CM | POA: Diagnosis not present

## 2022-03-13 DIAGNOSIS — N138 Other obstructive and reflux uropathy: Secondary | ICD-10-CM

## 2022-03-13 LAB — CBC WITH DIFFERENTIAL/PLATELET
Basophils Absolute: 0.1 10*3/uL (ref 0.0–0.1)
Basophils Relative: 0.9 % (ref 0.0–3.0)
Eosinophils Absolute: 0.2 10*3/uL (ref 0.0–0.7)
Eosinophils Relative: 2.7 % (ref 0.0–5.0)
HCT: 49.6 % (ref 39.0–52.0)
Hemoglobin: 16.9 g/dL (ref 13.0–17.0)
Lymphocytes Relative: 31.3 % (ref 12.0–46.0)
Lymphs Abs: 1.9 10*3/uL (ref 0.7–4.0)
MCHC: 34 g/dL (ref 30.0–36.0)
MCV: 88.9 fl (ref 78.0–100.0)
Monocytes Absolute: 0.4 10*3/uL (ref 0.1–1.0)
Monocytes Relative: 7.1 % (ref 3.0–12.0)
Neutro Abs: 3.5 10*3/uL (ref 1.4–7.7)
Neutrophils Relative %: 58 % (ref 43.0–77.0)
Platelets: 195 10*3/uL (ref 150.0–400.0)
RBC: 5.58 Mil/uL (ref 4.22–5.81)
RDW: 13.6 % (ref 11.5–15.5)
WBC: 6.1 10*3/uL (ref 4.0–10.5)

## 2022-03-13 LAB — BASIC METABOLIC PANEL
BUN: 21 mg/dL (ref 6–23)
CO2: 28 mEq/L (ref 19–32)
Calcium: 9.7 mg/dL (ref 8.4–10.5)
Chloride: 105 mEq/L (ref 96–112)
Creatinine, Ser: 1.08 mg/dL (ref 0.40–1.50)
GFR: 71.79 mL/min (ref >60.00)
Glucose, Bld: 97 mg/dL (ref 70–99)
Potassium: 4.7 mEq/L (ref 3.5–5.1)
Sodium: 140 mEq/L (ref 135–145)

## 2022-03-13 LAB — LIPID PANEL
Cholesterol: 201 mg/dL — ABNORMAL HIGH (ref 0–200)
HDL: 46.1 mg/dL (ref >39.00)
LDL Cholesterol: 127 mg/dL — ABNORMAL HIGH (ref 0–99)
NonHDL: 155.06
Total CHOL/HDL Ratio: 4
Triglycerides: 142 mg/dL (ref 0.0–149.0)
VLDL: 28.4 mg/dL (ref 0.0–40.0)

## 2022-03-13 LAB — HEPATIC FUNCTION PANEL
ALT: 25 U/L (ref 0–53)
AST: 20 U/L (ref 0–37)
Albumin: 4.4 g/dL (ref 3.5–5.2)
Alkaline Phosphatase: 69 U/L (ref 39–117)
Bilirubin, Direct: 0.1 mg/dL (ref 0.0–0.3)
Total Bilirubin: 0.9 mg/dL (ref 0.2–1.2)
Total Protein: 7 g/dL (ref 6.0–8.3)

## 2022-03-13 LAB — PSA: PSA: 3.91 ng/mL (ref 0.10–4.00)

## 2022-03-13 LAB — HEMOGLOBIN A1C: Hgb A1c MFr Bld: 5.4 % (ref 4.6–6.5)

## 2022-03-13 LAB — TSH: TSH: 1.03 u[IU]/mL (ref 0.35–5.50)

## 2022-03-13 MED ORDER — METHYLPREDNISOLONE 4 MG PO TBPK: ORAL_TABLET | ORAL | 0 refills | Status: DC

## 2022-03-13 NOTE — Progress Notes (Signed)
   Subjective:    Patient ID: Bryan Hurst, male    DOB: 23-May-1955, 67 y.o.   MRN: [id number]  HPI Here for dizziness which began suddenly 3 nights ago when he turned over in bed. He says it felt like the bed was spinning around, but there was no headache or nausea. The next day the dizziness was worse, but he was able to take his daily walk and do whatever he wanted. Since then this has improved a lot, but he is still slightly off balance. No recent head trauma.    Review of Systems  Constitutional: Negative.   HENT: Negative.    Eyes: Negative.   Respiratory: Negative.    Cardiovascular: Negative.   Neurological:  Positive for dizziness. Negative for tremors, seizures, syncope, facial asymmetry, speech difficulty, weakness, light-headedness, numbness and headaches.       Objective:   Physical Exam Constitutional:      General: He is not in acute distress.    Appearance: Normal appearance.  HENT:     Right Ear: Tympanic membrane, ear canal and external ear normal.     Left Ear: Tympanic membrane, ear canal and external ear normal.     Nose: Nose normal.     Mouth/Throat:     Pharynx: Oropharynx is clear.  Eyes:     Extraocular Movements: Extraocular movements intact.     Conjunctiva/sclera: Conjunctivae normal.     Pupils: Pupils are equal, round, and reactive to light.  Cardiovascular:     Rate and Rhythm: Normal rate and regular rhythm.     Pulses: Normal pulses.     Heart sounds: Normal heart sounds.  Pulmonary:     Effort: Pulmonary effort is normal.     Breath sounds: Normal breath sounds.  Lymphadenopathy:     Cervical: No cervical adenopathy.  Neurological:     General: No focal deficit present.     Mental Status: He is alert and oriented to person, place, and time.     Coordination: Coordination normal.     Gait: Gait normal.           Assessment & Plan:  He has a mild case of vertigo, and it appears to be resolving. We will treat him with a Medrol dose  pack, and I suggested he take a Claritin daily for a week or two. Recheck as needed.  Alysia Penna, MD

## 2022-03-23 ENCOUNTER — Ambulatory Visit (INDEPENDENT_AMBULATORY_CARE_PROVIDER_SITE_OTHER): Payer: BC Managed Care – PPO | Admitting: Family Medicine

## 2022-03-23 ENCOUNTER — Encounter: Payer: Self-pay | Admitting: Family Medicine

## 2022-03-23 VITALS — BP 110/78 | HR 68 | Temp 97.7°F | Ht 74.0 in | Wt 200.0 lb

## 2022-03-23 DIAGNOSIS — Z23 Encounter for immunization: Secondary | ICD-10-CM

## 2022-03-23 DIAGNOSIS — G250 Essential tremor: Secondary | ICD-10-CM | POA: Diagnosis not present

## 2022-03-23 DIAGNOSIS — Z0000 Encounter for general adult medical examination without abnormal findings: Secondary | ICD-10-CM | POA: Diagnosis not present

## 2022-03-23 NOTE — Addendum Note (Signed)
Addended by: Wyvonne Lenz on: 03/23/2022 11:28 AM   Modules accepted: Orders

## 2022-03-23 NOTE — Progress Notes (Signed)
Subjective:    Patient ID: Bryan Hurst, male    DOB: [birth year], 67 y.o.   MRN: [id number]  HPI Here for a well exam. He feels well except he mentions a slight shaking in the left hand when he is holding something like a coffee cup. This started about 2  years ago, and it has not changed at all. It only happens about every 2 weeks, and it only lasts a few minutes. He admits to drinking a lof of caffeine every day. We saw him recently for some vertigo, and after treating this with a Medrol dose pack, it went away.    Review of Systems  Constitutional: Negative.   HENT: Negative.    Eyes: Negative.   Respiratory: Negative.    Cardiovascular: Negative.   Gastrointestinal: Negative.   Genitourinary: Negative.   Musculoskeletal: Negative.   Skin: Negative.   Neurological:  Positive for tremors.  Psychiatric/Behavioral: Negative.         Objective:   Physical Exam Constitutional:      General: He is not in acute distress.    Appearance: Normal appearance. He is well-developed. He is not diaphoretic.  HENT:     Head: Normocephalic and atraumatic.     Right Ear: External ear normal.     Left Ear: External ear normal.     Nose: Nose normal.     Mouth/Throat:     Pharynx: No oropharyngeal exudate.  Eyes:     General: No scleral icterus.       Right eye: No discharge.        Left eye: No discharge.     Conjunctiva/sclera: Conjunctivae normal.     Pupils: Pupils are equal, round, and reactive to light.  Neck:     Thyroid: No thyromegaly.     Vascular: No JVD.     Trachea: No tracheal deviation.  Cardiovascular:     Rate and Rhythm: Normal rate and regular rhythm.     Heart sounds: Normal heart sounds. No murmur heard.    No friction rub. No gallop.  Pulmonary:     Effort: Pulmonary effort is normal. No respiratory distress.     Breath sounds: Normal breath sounds. No wheezing or rales.  Chest:     Chest wall: No tenderness.  Abdominal:     General: Bowel sounds are  normal. There is no distension.     Palpations: Abdomen is soft. There is no mass.     Tenderness: There is no abdominal tenderness. There is no guarding or rebound.  Genitourinary:    Penis: Normal. No tenderness.      Testes: Normal.     Prostate: Normal.     Rectum: Normal. Guaiac result negative.  Musculoskeletal:        General: No tenderness. Normal range of motion.     Cervical back: Neck supple.  Lymphadenopathy:     Cervical: No cervical adenopathy.  Skin:    General: Skin is warm and dry.     Coloration: Skin is not pale.     Findings: No erythema or rash.  Neurological:     General: No focal deficit present.     Mental Status: He is alert and oriented to person, place, and time.     Cranial Nerves: No cranial nerve deficit.     Motor: No abnormal muscle tone.     Coordination: Coordination normal.     Deep Tendon Reflexes: Reflexes are normal and symmetric. Reflexes normal.  Psychiatric:  Behavior: Behavior normal.        Thought Content: Thought content normal.        Judgment: Judgment normal.           Assessment & Plan:  Well exam. We discussed diet and exercise. We reviewed his lab results from a few weeks ago, and he has already made some changes to get his cholesterol levels down. He has an essential tremor, and he says he will reduce his caffeine intake. He will let us know if this changes in any way.  Alysia Penna, MD

## 2022-04-06 DIAGNOSIS — M9901 Segmental and somatic dysfunction of cervical region: Secondary | ICD-10-CM | POA: Diagnosis not present

## 2022-04-06 DIAGNOSIS — M9902 Segmental and somatic dysfunction of thoracic region: Secondary | ICD-10-CM | POA: Diagnosis not present

## 2022-04-06 DIAGNOSIS — M7702 Medial epicondylitis, left elbow: Secondary | ICD-10-CM | POA: Diagnosis not present

## 2022-04-06 DIAGNOSIS — M9906 Segmental and somatic dysfunction of lower extremity: Secondary | ICD-10-CM | POA: Diagnosis not present

## 2022-05-04 DIAGNOSIS — M7918 Myalgia, other site: Secondary | ICD-10-CM | POA: Diagnosis not present

## 2022-05-04 DIAGNOSIS — M25632 Stiffness of left wrist, not elsewhere classified: Secondary | ICD-10-CM | POA: Diagnosis not present

## 2022-05-04 DIAGNOSIS — M7702 Medial epicondylitis, left elbow: Secondary | ICD-10-CM | POA: Diagnosis not present

## 2022-05-04 DIAGNOSIS — M25522 Pain in left elbow: Secondary | ICD-10-CM | POA: Diagnosis not present

## 2022-05-04 DIAGNOSIS — M546 Pain in thoracic spine: Secondary | ICD-10-CM | POA: Diagnosis not present

## 2022-05-04 DIAGNOSIS — M542 Cervicalgia: Secondary | ICD-10-CM | POA: Diagnosis not present

## 2022-05-04 DIAGNOSIS — M9901 Segmental and somatic dysfunction of cervical region: Secondary | ICD-10-CM | POA: Diagnosis not present

## 2022-05-04 DIAGNOSIS — M9902 Segmental and somatic dysfunction of thoracic region: Secondary | ICD-10-CM | POA: Diagnosis not present

## 2022-05-04 DIAGNOSIS — M9906 Segmental and somatic dysfunction of lower extremity: Secondary | ICD-10-CM | POA: Diagnosis not present

## 2022-06-01 DIAGNOSIS — M9901 Segmental and somatic dysfunction of cervical region: Secondary | ICD-10-CM | POA: Diagnosis not present

## 2022-06-01 DIAGNOSIS — M9902 Segmental and somatic dysfunction of thoracic region: Secondary | ICD-10-CM | POA: Diagnosis not present

## 2022-06-01 DIAGNOSIS — M7702 Medial epicondylitis, left elbow: Secondary | ICD-10-CM | POA: Diagnosis not present

## 2022-06-01 DIAGNOSIS — M9906 Segmental and somatic dysfunction of lower extremity: Secondary | ICD-10-CM | POA: Diagnosis not present

## 2022-06-29 DIAGNOSIS — M9902 Segmental and somatic dysfunction of thoracic region: Secondary | ICD-10-CM | POA: Diagnosis not present

## 2022-06-29 DIAGNOSIS — M9906 Segmental and somatic dysfunction of lower extremity: Secondary | ICD-10-CM | POA: Diagnosis not present

## 2022-06-29 DIAGNOSIS — M9901 Segmental and somatic dysfunction of cervical region: Secondary | ICD-10-CM | POA: Diagnosis not present

## 2022-06-29 DIAGNOSIS — M7702 Medial epicondylitis, left elbow: Secondary | ICD-10-CM | POA: Diagnosis not present

## 2022-07-27 DIAGNOSIS — M25522 Pain in left elbow: Secondary | ICD-10-CM | POA: Diagnosis not present

## 2022-07-27 DIAGNOSIS — M9901 Segmental and somatic dysfunction of cervical region: Secondary | ICD-10-CM | POA: Diagnosis not present

## 2022-07-27 DIAGNOSIS — M9906 Segmental and somatic dysfunction of lower extremity: Secondary | ICD-10-CM | POA: Diagnosis not present

## 2022-07-27 DIAGNOSIS — M542 Cervicalgia: Secondary | ICD-10-CM | POA: Diagnosis not present

## 2022-07-27 DIAGNOSIS — M7702 Medial epicondylitis, left elbow: Secondary | ICD-10-CM | POA: Diagnosis not present

## 2022-07-27 DIAGNOSIS — M9902 Segmental and somatic dysfunction of thoracic region: Secondary | ICD-10-CM | POA: Diagnosis not present

## 2022-07-27 DIAGNOSIS — M7918 Myalgia, other site: Secondary | ICD-10-CM | POA: Diagnosis not present

## 2022-07-27 DIAGNOSIS — M546 Pain in thoracic spine: Secondary | ICD-10-CM | POA: Diagnosis not present

## 2022-07-27 DIAGNOSIS — M25632 Stiffness of left wrist, not elsewhere classified: Secondary | ICD-10-CM | POA: Diagnosis not present

## 2022-08-24 DIAGNOSIS — M25522 Pain in left elbow: Secondary | ICD-10-CM | POA: Diagnosis not present

## 2022-08-24 DIAGNOSIS — M9906 Segmental and somatic dysfunction of lower extremity: Secondary | ICD-10-CM | POA: Diagnosis not present

## 2022-08-24 DIAGNOSIS — M25632 Stiffness of left wrist, not elsewhere classified: Secondary | ICD-10-CM | POA: Diagnosis not present

## 2022-08-24 DIAGNOSIS — M7918 Myalgia, other site: Secondary | ICD-10-CM | POA: Diagnosis not present

## 2022-08-24 DIAGNOSIS — M7702 Medial epicondylitis, left elbow: Secondary | ICD-10-CM | POA: Diagnosis not present

## 2022-08-24 DIAGNOSIS — M9901 Segmental and somatic dysfunction of cervical region: Secondary | ICD-10-CM | POA: Diagnosis not present

## 2022-08-24 DIAGNOSIS — M9902 Segmental and somatic dysfunction of thoracic region: Secondary | ICD-10-CM | POA: Diagnosis not present

## 2022-09-21 DIAGNOSIS — M25632 Stiffness of left wrist, not elsewhere classified: Secondary | ICD-10-CM | POA: Diagnosis not present

## 2022-09-21 DIAGNOSIS — M7702 Medial epicondylitis, left elbow: Secondary | ICD-10-CM | POA: Diagnosis not present

## 2022-09-21 DIAGNOSIS — M9902 Segmental and somatic dysfunction of thoracic region: Secondary | ICD-10-CM | POA: Diagnosis not present

## 2022-09-21 DIAGNOSIS — M9901 Segmental and somatic dysfunction of cervical region: Secondary | ICD-10-CM | POA: Diagnosis not present

## 2022-09-21 DIAGNOSIS — M9906 Segmental and somatic dysfunction of lower extremity: Secondary | ICD-10-CM | POA: Diagnosis not present

## 2022-09-21 DIAGNOSIS — M7918 Myalgia, other site: Secondary | ICD-10-CM | POA: Diagnosis not present

## 2022-10-19 DIAGNOSIS — M25632 Stiffness of left wrist, not elsewhere classified: Secondary | ICD-10-CM | POA: Diagnosis not present

## 2022-10-19 DIAGNOSIS — M9901 Segmental and somatic dysfunction of cervical region: Secondary | ICD-10-CM | POA: Diagnosis not present

## 2022-10-19 DIAGNOSIS — M7918 Myalgia, other site: Secondary | ICD-10-CM | POA: Diagnosis not present

## 2022-10-19 DIAGNOSIS — M9902 Segmental and somatic dysfunction of thoracic region: Secondary | ICD-10-CM | POA: Diagnosis not present

## 2022-10-19 DIAGNOSIS — M25522 Pain in left elbow: Secondary | ICD-10-CM | POA: Diagnosis not present

## 2022-10-19 DIAGNOSIS — M7702 Medial epicondylitis, left elbow: Secondary | ICD-10-CM | POA: Diagnosis not present

## 2022-10-19 DIAGNOSIS — M9906 Segmental and somatic dysfunction of lower extremity: Secondary | ICD-10-CM | POA: Diagnosis not present

## 2022-11-12 DIAGNOSIS — M9901 Segmental and somatic dysfunction of cervical region: Secondary | ICD-10-CM | POA: Diagnosis not present

## 2022-11-12 DIAGNOSIS — M9902 Segmental and somatic dysfunction of thoracic region: Secondary | ICD-10-CM | POA: Diagnosis not present

## 2022-11-12 DIAGNOSIS — M7702 Medial epicondylitis, left elbow: Secondary | ICD-10-CM | POA: Diagnosis not present

## 2022-11-12 DIAGNOSIS — M25632 Stiffness of left wrist, not elsewhere classified: Secondary | ICD-10-CM | POA: Diagnosis not present

## 2022-11-12 DIAGNOSIS — M7918 Myalgia, other site: Secondary | ICD-10-CM | POA: Diagnosis not present

## 2022-11-12 DIAGNOSIS — M9906 Segmental and somatic dysfunction of lower extremity: Secondary | ICD-10-CM | POA: Diagnosis not present

## 2023-01-11 DIAGNOSIS — M7702 Medial epicondylitis, left elbow: Secondary | ICD-10-CM | POA: Diagnosis not present

## 2023-01-11 DIAGNOSIS — M9907 Segmental and somatic dysfunction of upper extremity: Secondary | ICD-10-CM | POA: Diagnosis not present

## 2023-01-11 DIAGNOSIS — M9901 Segmental and somatic dysfunction of cervical region: Secondary | ICD-10-CM | POA: Diagnosis not present

## 2023-01-11 DIAGNOSIS — M9902 Segmental and somatic dysfunction of thoracic region: Secondary | ICD-10-CM | POA: Diagnosis not present

## 2023-02-13 DIAGNOSIS — M7918 Myalgia, other site: Secondary | ICD-10-CM | POA: Diagnosis not present

## 2023-02-13 DIAGNOSIS — M7702 Medial epicondylitis, left elbow: Secondary | ICD-10-CM | POA: Diagnosis not present

## 2023-02-13 DIAGNOSIS — M25632 Stiffness of left wrist, not elsewhere classified: Secondary | ICD-10-CM | POA: Diagnosis not present

## 2023-02-13 DIAGNOSIS — M9901 Segmental and somatic dysfunction of cervical region: Secondary | ICD-10-CM | POA: Diagnosis not present

## 2023-02-13 DIAGNOSIS — M9907 Segmental and somatic dysfunction of upper extremity: Secondary | ICD-10-CM | POA: Diagnosis not present

## 2023-02-13 DIAGNOSIS — M9902 Segmental and somatic dysfunction of thoracic region: Secondary | ICD-10-CM | POA: Diagnosis not present

## 2023-03-13 DIAGNOSIS — M7702 Medial epicondylitis, left elbow: Secondary | ICD-10-CM | POA: Diagnosis not present

## 2023-03-13 DIAGNOSIS — M25632 Stiffness of left wrist, not elsewhere classified: Secondary | ICD-10-CM | POA: Diagnosis not present

## 2023-03-13 DIAGNOSIS — M9901 Segmental and somatic dysfunction of cervical region: Secondary | ICD-10-CM | POA: Diagnosis not present

## 2023-03-13 DIAGNOSIS — M9902 Segmental and somatic dysfunction of thoracic region: Secondary | ICD-10-CM | POA: Diagnosis not present

## 2023-03-13 DIAGNOSIS — M7918 Myalgia, other site: Secondary | ICD-10-CM | POA: Diagnosis not present

## 2023-03-13 DIAGNOSIS — M9907 Segmental and somatic dysfunction of upper extremity: Secondary | ICD-10-CM | POA: Diagnosis not present

## 2023-04-09 DIAGNOSIS — M25561 Pain in right knee: Secondary | ICD-10-CM | POA: Diagnosis not present

## 2023-04-15 DIAGNOSIS — M25632 Stiffness of left wrist, not elsewhere classified: Secondary | ICD-10-CM | POA: Diagnosis not present

## 2023-04-15 DIAGNOSIS — M7702 Medial epicondylitis, left elbow: Secondary | ICD-10-CM | POA: Diagnosis not present

## 2023-04-15 DIAGNOSIS — M9902 Segmental and somatic dysfunction of thoracic region: Secondary | ICD-10-CM | POA: Diagnosis not present

## 2023-04-15 DIAGNOSIS — M9901 Segmental and somatic dysfunction of cervical region: Secondary | ICD-10-CM | POA: Diagnosis not present

## 2023-04-15 DIAGNOSIS — M9907 Segmental and somatic dysfunction of upper extremity: Secondary | ICD-10-CM | POA: Diagnosis not present

## 2023-04-15 DIAGNOSIS — M7918 Myalgia, other site: Secondary | ICD-10-CM | POA: Diagnosis not present

## 2023-04-29 DIAGNOSIS — H53143 Visual discomfort, bilateral: Secondary | ICD-10-CM | POA: Diagnosis not present

## 2023-04-29 DIAGNOSIS — H2513 Age-related nuclear cataract, bilateral: Secondary | ICD-10-CM | POA: Diagnosis not present

## 2023-05-08 DIAGNOSIS — M7702 Medial epicondylitis, left elbow: Secondary | ICD-10-CM | POA: Diagnosis not present

## 2023-05-08 DIAGNOSIS — M9902 Segmental and somatic dysfunction of thoracic region: Secondary | ICD-10-CM | POA: Diagnosis not present

## 2023-05-08 DIAGNOSIS — M25632 Stiffness of left wrist, not elsewhere classified: Secondary | ICD-10-CM | POA: Diagnosis not present

## 2023-05-08 DIAGNOSIS — M9901 Segmental and somatic dysfunction of cervical region: Secondary | ICD-10-CM | POA: Diagnosis not present

## 2023-05-08 DIAGNOSIS — M9907 Segmental and somatic dysfunction of upper extremity: Secondary | ICD-10-CM | POA: Diagnosis not present

## 2023-05-08 DIAGNOSIS — M7918 Myalgia, other site: Secondary | ICD-10-CM | POA: Diagnosis not present

## 2023-05-27 ENCOUNTER — Encounter (HOSPITAL_COMMUNITY): Payer: Self-pay

## 2023-06-03 ENCOUNTER — Encounter: Payer: Self-pay | Admitting: Family Medicine

## 2023-06-03 ENCOUNTER — Ambulatory Visit (INDEPENDENT_AMBULATORY_CARE_PROVIDER_SITE_OTHER): Admitting: Family Medicine

## 2023-06-03 VITALS — BP 132/88 | HR 71 | Temp 97.7°F | Ht 72.75 in | Wt 199.4 lb

## 2023-06-03 DIAGNOSIS — Z125 Encounter for screening for malignant neoplasm of prostate: Secondary | ICD-10-CM | POA: Diagnosis not present

## 2023-06-03 DIAGNOSIS — Z0000 Encounter for general adult medical examination without abnormal findings: Secondary | ICD-10-CM | POA: Diagnosis not present

## 2023-06-03 DIAGNOSIS — R972 Elevated prostate specific antigen [PSA]: Secondary | ICD-10-CM | POA: Diagnosis not present

## 2023-06-03 LAB — COMPREHENSIVE METABOLIC PANEL WITH GFR
ALT: 25 U/L (ref 0–53)
AST: 22 U/L (ref 0–37)
Albumin: 4.8 g/dL (ref 3.5–5.2)
Alkaline Phosphatase: 60 U/L (ref 39–117)
BUN: 22 mg/dL (ref 6–23)
CO2: 29 meq/L (ref 19–32)
Calcium: 9.2 mg/dL (ref 8.4–10.5)
Chloride: 105 meq/L (ref 96–112)
Creatinine, Ser: 1.05 mg/dL (ref 0.40–1.50)
GFR: 73.62 mL/min (ref >60.00)
Glucose, Bld: 99 mg/dL (ref 70–99)
Potassium: 4.4 meq/L (ref 3.5–5.1)
Sodium: 142 meq/L (ref 135–145)
Total Bilirubin: 0.8 mg/dL (ref 0.2–1.2)
Total Protein: 7.2 g/dL (ref 6.0–8.3)

## 2023-06-03 LAB — CBC WITH DIFFERENTIAL/PLATELET
Basophils Absolute: 0.1 10*3/uL (ref 0.0–0.1)
Basophils Relative: 0.8 % (ref 0.0–3.0)
Eosinophils Absolute: 0.1 10*3/uL (ref 0.0–0.7)
Eosinophils Relative: 2.2 % (ref 0.0–5.0)
HCT: 47.9 % (ref 39.0–52.0)
Hemoglobin: 16.2 g/dL (ref 13.0–17.0)
Lymphocytes Relative: 27.4 % (ref 12.0–46.0)
Lymphs Abs: 1.8 10*3/uL (ref 0.7–4.0)
MCHC: 33.8 g/dL (ref 30.0–36.0)
MCV: 88.2 fl (ref 78.0–100.0)
Monocytes Absolute: 0.5 10*3/uL (ref 0.1–1.0)
Monocytes Relative: 7.7 % (ref 3.0–12.0)
Neutro Abs: 4 10*3/uL (ref 1.4–7.7)
Neutrophils Relative %: 61.9 % (ref 43.0–77.0)
Platelets: 187 10*3/uL (ref 150.0–400.0)
RBC: 5.43 Mil/uL (ref 4.22–5.81)
RDW: 13.8 % (ref 11.5–15.5)
WBC: 6.4 10*3/uL (ref 4.0–10.5)

## 2023-06-03 LAB — LIPID PANEL
Cholesterol: 209 mg/dL — ABNORMAL HIGH (ref 0–200)
HDL: 49.6 mg/dL (ref >39.00)
LDL Cholesterol: 135 mg/dL — ABNORMAL HIGH (ref 0–99)
NonHDL: 159.81
Total CHOL/HDL Ratio: 4
Triglycerides: 123 mg/dL (ref 0.0–149.0)
VLDL: 24.6 mg/dL (ref 0.0–40.0)

## 2023-06-03 LAB — HEMOGLOBIN A1C: Hgb A1c MFr Bld: 5.5 % (ref 4.6–6.5)

## 2023-06-03 NOTE — Progress Notes (Signed)
 Subjective:    Patient ID: Bryan Hurst, male    DOB: [birth year], 68 y.o.   MRN: [id number]  HPI Here for a well exam. He feels great.    Review of Systems  Constitutional: Negative.   HENT: Negative.    Eyes: Negative.   Respiratory: Negative.    Cardiovascular: Negative.   Gastrointestinal: Negative.   Genitourinary: Negative.   Musculoskeletal: Negative.   Skin: Negative.   Neurological: Negative.   Psychiatric/Behavioral: Negative.         Objective:   Physical Exam Constitutional:      General: He is not in acute distress.    Appearance: Normal appearance. He is well-developed. He is not diaphoretic.  HENT:     Head: Normocephalic and atraumatic.     Right Ear: External ear normal.     Left Ear: External ear normal.     Nose: Nose normal.     Mouth/Throat:     Pharynx: No oropharyngeal exudate.  Eyes:     General: No scleral icterus.       Right eye: No discharge.        Left eye: No discharge.     Conjunctiva/sclera: Conjunctivae normal.     Pupils: Pupils are equal, round, and reactive to light.  Neck:     Thyroid : No thyromegaly.     Vascular: No JVD.     Trachea: No tracheal deviation.  Cardiovascular:     Rate and Rhythm: Normal rate and regular rhythm.     Pulses: Normal pulses.     Heart sounds: Normal heart sounds. No murmur heard.    No friction rub. No gallop.  Pulmonary:     Effort: Pulmonary effort is normal. No respiratory distress.     Breath sounds: Normal breath sounds. No wheezing or rales.  Chest:     Chest wall: No tenderness.  Abdominal:     General: Bowel sounds are normal. There is no distension.     Palpations: Abdomen is soft. There is no mass.     Tenderness: There is no abdominal tenderness. There is no guarding or rebound.  Genitourinary:    Penis: Normal. No tenderness.      Testes: Normal.     Prostate: Normal.     Rectum: Normal. Guaiac result negative.  Musculoskeletal:        General: No tenderness. Normal range  of motion.     Cervical back: Neck supple.  Lymphadenopathy:     Cervical: No cervical adenopathy.  Skin:    General: Skin is warm and dry.     Coloration: Skin is not pale.     Findings: No erythema or rash.  Neurological:     General: No focal deficit present.     Mental Status: He is alert and oriented to person, place, and time.     Cranial Nerves: No cranial nerve deficit.     Motor: No abnormal muscle tone.     Coordination: Coordination normal.     Deep Tendon Reflexes: Reflexes are normal and symmetric. Reflexes normal.  Psychiatric:        Mood and Affect: Mood normal.        Behavior: Behavior normal.        Thought Content: Thought content normal.        Judgment: Judgment normal.           Assessment & Plan:  Well exam. We discussed diet and exercise. Get fasting labs. Corita Diego, MD

## 2023-06-04 LAB — PSA: PSA: 4.11 ng/mL — ABNORMAL HIGH (ref 0.10–4.00)

## 2023-06-04 LAB — TSH: TSH: 0.83 u[IU]/mL (ref 0.35–5.50)

## 2023-06-05 ENCOUNTER — Ambulatory Visit: Payer: Self-pay | Admitting: Family Medicine

## 2023-06-05 DIAGNOSIS — M7702 Medial epicondylitis, left elbow: Secondary | ICD-10-CM | POA: Diagnosis not present

## 2023-06-05 DIAGNOSIS — M9902 Segmental and somatic dysfunction of thoracic region: Secondary | ICD-10-CM | POA: Diagnosis not present

## 2023-06-05 DIAGNOSIS — M9901 Segmental and somatic dysfunction of cervical region: Secondary | ICD-10-CM | POA: Diagnosis not present

## 2023-06-05 DIAGNOSIS — M7918 Myalgia, other site: Secondary | ICD-10-CM | POA: Diagnosis not present

## 2023-06-05 DIAGNOSIS — M9907 Segmental and somatic dysfunction of upper extremity: Secondary | ICD-10-CM | POA: Diagnosis not present

## 2023-06-05 DIAGNOSIS — M25632 Stiffness of left wrist, not elsewhere classified: Secondary | ICD-10-CM | POA: Diagnosis not present

## 2023-06-05 NOTE — Addendum Note (Signed)
 Addended by: Henry Loge on: 06/05/2023 03:55 PM   Modules accepted: Orders

## 2023-06-05 NOTE — Addendum Note (Signed)
 Addended by: Corita Diego A on: 06/05/2023 10:30 AM   Modules accepted: Orders

## 2023-07-03 DIAGNOSIS — M25632 Stiffness of left wrist, not elsewhere classified: Secondary | ICD-10-CM | POA: Diagnosis not present

## 2023-07-03 DIAGNOSIS — M9902 Segmental and somatic dysfunction of thoracic region: Secondary | ICD-10-CM | POA: Diagnosis not present

## 2023-07-03 DIAGNOSIS — M7918 Myalgia, other site: Secondary | ICD-10-CM | POA: Diagnosis not present

## 2023-07-03 DIAGNOSIS — M9901 Segmental and somatic dysfunction of cervical region: Secondary | ICD-10-CM | POA: Diagnosis not present

## 2023-07-03 DIAGNOSIS — M7702 Medial epicondylitis, left elbow: Secondary | ICD-10-CM | POA: Diagnosis not present

## 2023-07-03 DIAGNOSIS — M9907 Segmental and somatic dysfunction of upper extremity: Secondary | ICD-10-CM | POA: Diagnosis not present

## 2023-09-04 ENCOUNTER — Other Ambulatory Visit (INDEPENDENT_AMBULATORY_CARE_PROVIDER_SITE_OTHER)

## 2023-09-04 DIAGNOSIS — R972 Elevated prostate specific antigen [PSA]: Secondary | ICD-10-CM

## 2023-09-04 LAB — PSA: PSA: 4.75 ng/mL — ABNORMAL HIGH (ref 0.10–4.00)

## 2023-09-04 NOTE — Addendum Note (Signed)
 Addended by: JOHNNY SENIOR A on: 09/04/2023 12:15 PM   Modules accepted: Orders

## 2023-09-10 DIAGNOSIS — R972 Elevated prostate specific antigen [PSA]: Secondary | ICD-10-CM | POA: Diagnosis not present

## 2023-09-12 ENCOUNTER — Other Ambulatory Visit: Payer: Self-pay | Admitting: Urology

## 2023-09-12 DIAGNOSIS — R972 Elevated prostate specific antigen [PSA]: Secondary | ICD-10-CM

## 2023-09-13 ENCOUNTER — Encounter: Payer: Self-pay | Admitting: Urology

## 2023-10-14 ENCOUNTER — Encounter: Payer: Self-pay | Admitting: Urology

## 2023-10-25 ENCOUNTER — Ambulatory Visit
Admission: RE | Admit: 2023-10-25 | Discharge: 2023-10-25 | Disposition: A | Discharge status: Home / Self Care | Source: Ambulatory Visit | Attending: Urology | Admitting: Urology

## 2023-10-25 DIAGNOSIS — R972 Elevated prostate specific antigen [PSA]: Secondary | ICD-10-CM

## 2023-10-25 MED ORDER — GADOPICLENOL 0.5 MMOL/ML IV SOLN
10.0000 mL | Freq: Once | INTRAVENOUS | Status: AC | PRN
  Administered 2023-10-25: 9 mL via INTRAVENOUS

## 2023-11-04 DIAGNOSIS — R972 Elevated prostate specific antigen [PSA]: Secondary | ICD-10-CM | POA: Diagnosis not present

## 2023-11-11 DIAGNOSIS — R972 Elevated prostate specific antigen [PSA]: Secondary | ICD-10-CM | POA: Diagnosis not present
